# Patient Record
Sex: Female | Born: 1985 | Race: White | Hispanic: No | Marital: Single | Smoking: Never smoker
Health system: Southern US, Community
[De-identification: ages and names within clinical notes are randomized; demographics above are authoritative.]

## PROBLEM LIST (undated history)

## (undated) DIAGNOSIS — R519 Headache, unspecified: Secondary | ICD-10-CM

## (undated) DIAGNOSIS — N946 Dysmenorrhea, unspecified: Secondary | ICD-10-CM

## (undated) DIAGNOSIS — G8929 Other chronic pain: Secondary | ICD-10-CM

## (undated) DIAGNOSIS — N926 Irregular menstruation, unspecified: Secondary | ICD-10-CM

## (undated) DIAGNOSIS — IMO0002 Reserved for concepts with insufficient information to code with codable children: Secondary | ICD-10-CM

## (undated) DIAGNOSIS — R87619 Unspecified abnormal cytological findings in specimens from cervix uteri: Secondary | ICD-10-CM

## (undated) DIAGNOSIS — R51 Headache: Principal | ICD-10-CM

## (undated) HISTORY — DX: Other chronic pain: G89.29

## (undated) HISTORY — DX: Irregular menstruation, unspecified: N92.6

## (undated) HISTORY — DX: Headache: R51

## (undated) HISTORY — DX: Headache, unspecified: R51.9

## (undated) HISTORY — PX: ADENOIDECTOMY: SUR15

## (undated) HISTORY — DX: Dysmenorrhea, unspecified: N94.6

## (undated) HISTORY — DX: Reserved for concepts with insufficient information to code with codable children: IMO0002

## (undated) HISTORY — PX: WISDOM TOOTH EXTRACTION: SHX21

## (undated) HISTORY — DX: Unspecified abnormal cytological findings in specimens from cervix uteri: R87.619

---

## 2003-12-16 ENCOUNTER — Other Ambulatory Visit: Admission: RE | Admit: 2003-12-16 | Discharge: 2003-12-16 | Payer: Self-pay | Admitting: Family Medicine

## 2005-03-19 ENCOUNTER — Other Ambulatory Visit: Admission: RE | Admit: 2005-03-19 | Discharge: 2005-03-19 | Payer: Self-pay | Admitting: Family Medicine

## 2006-01-01 ENCOUNTER — Encounter: Admission: RE | Admit: 2006-01-01 | Discharge: 2006-01-01 | Payer: Self-pay | Admitting: Family Medicine

## 2007-03-13 ENCOUNTER — Emergency Department (HOSPITAL_COMMUNITY): Admission: EM | Admit: 2007-03-13 | Discharge: 2007-03-13 | Payer: Self-pay | Admitting: Emergency Medicine

## 2011-01-01 ENCOUNTER — Encounter
Admission: RE | Admit: 2011-01-01 | Discharge: 2011-01-01 | Payer: Self-pay | Source: Home / Self Care | Attending: Family Medicine | Admitting: Family Medicine

## 2012-01-11 IMAGING — CT CT HEAD W/O CM
2 series · 16 of 30 positions shown, 18 images · non-contrast
Comparison: None.

CLINICAL DATA: Syncopal episode last night, fell hitting left
frontal region, some unsteady gait and lightheadedness

CT HEAD WITHOUT CONTRAST
TECHNIQUE: Contiguous axial images were obtained from the base of
the skull through the vertex without contrast.

[Series 2: head w/o · axial · non-contrast · 0.49mm/px · z∈[+46,+168]mm · 8 of 32 slices shown, 10 images]
[im 4/32  brain]
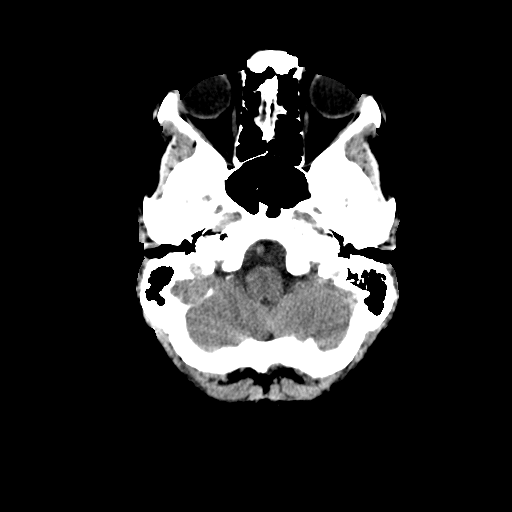
[im 4/32  bone]
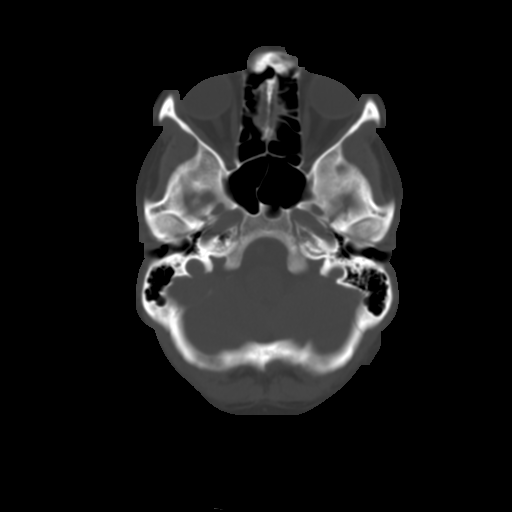
[im 7/32  brain]
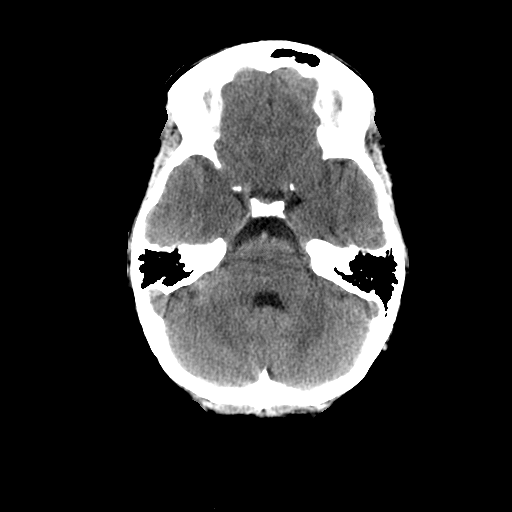
[im 11/32  brain]
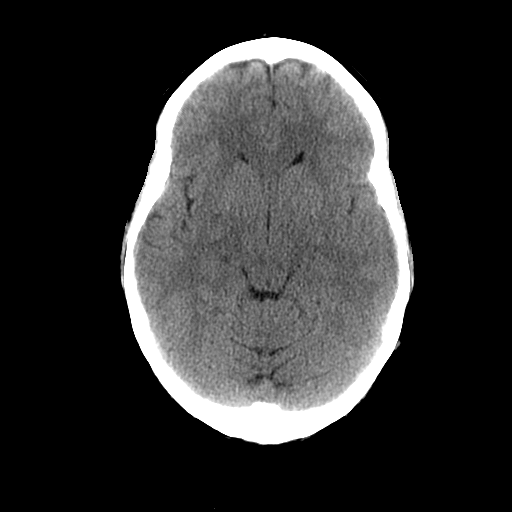
[im 14/32  brain]
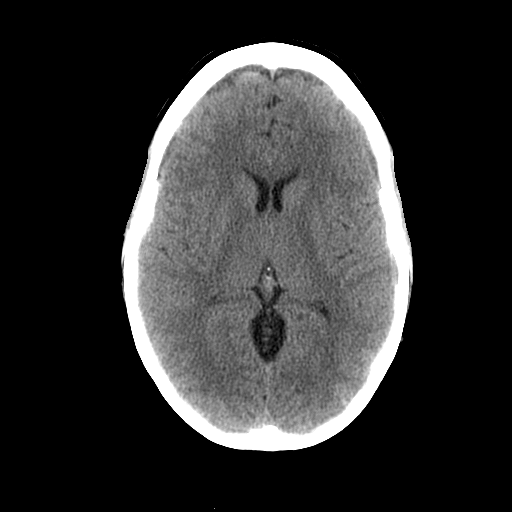
[im 18/32  brain]
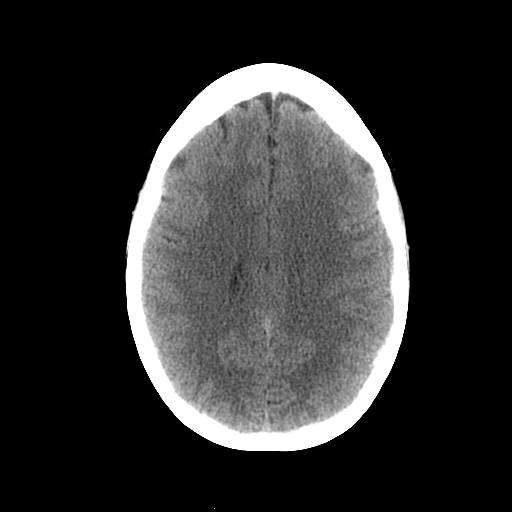
[im 18/32  bone]
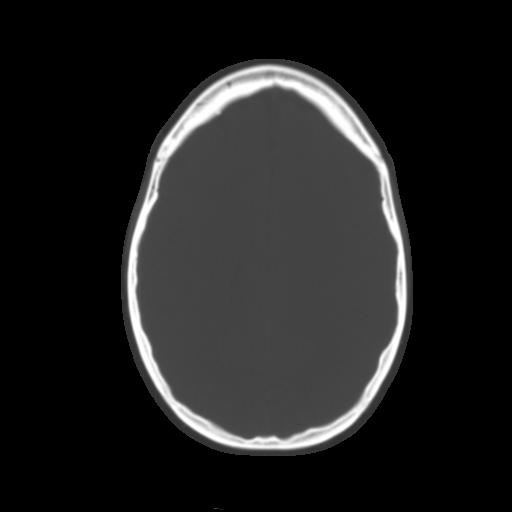
[im 21/32  brain]
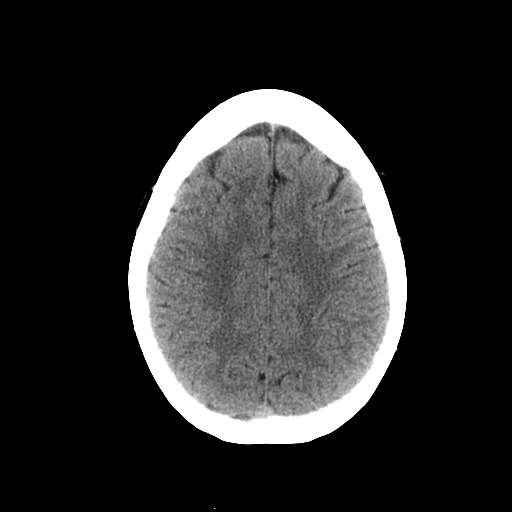
[im 25/32  brain]
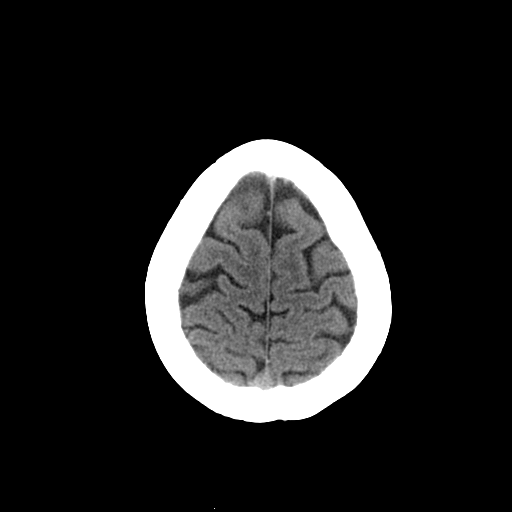
[im 28/32  brain]
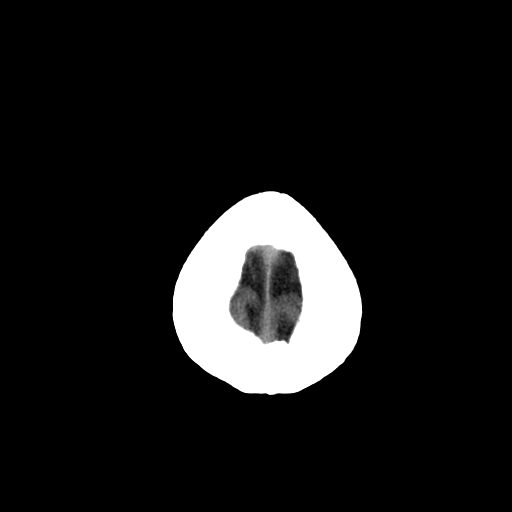

[Series 3: head bone · axial · 0.49mm/px · z∈[+44,+172]mm · 8 of 64 slices shown]
[im 7/64  bone]
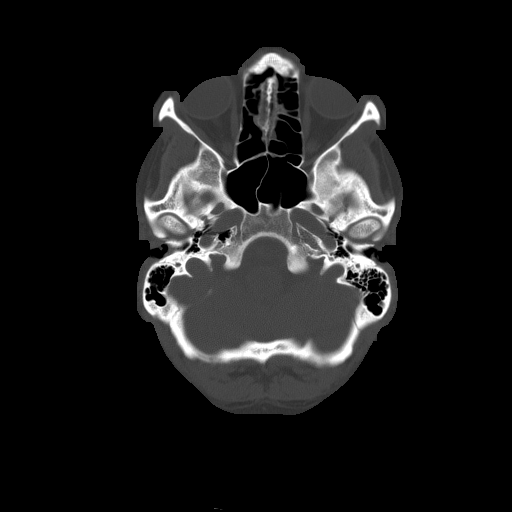
[im 14/64  bone]
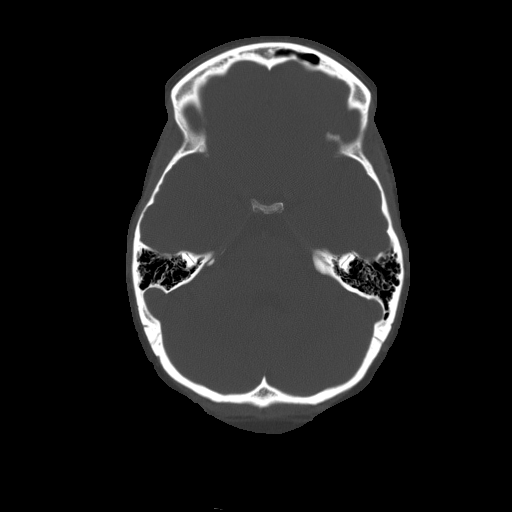
[im 20/64  bone]
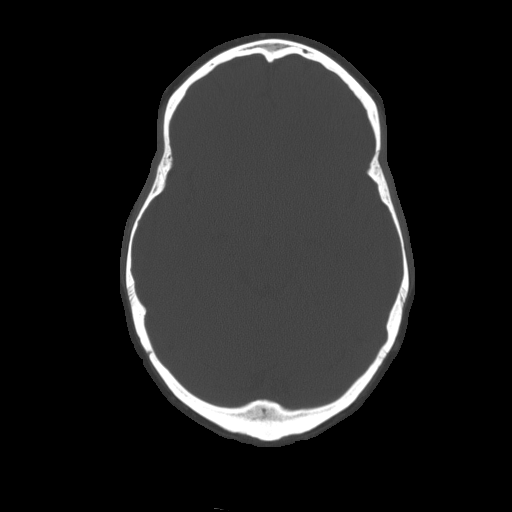
[im 27/64  bone]
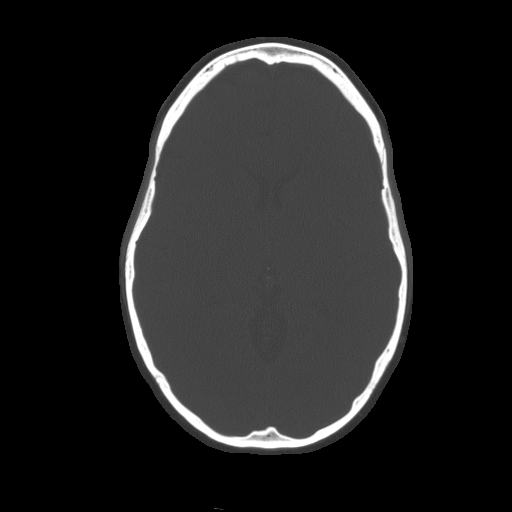
[im 37/64  bone]
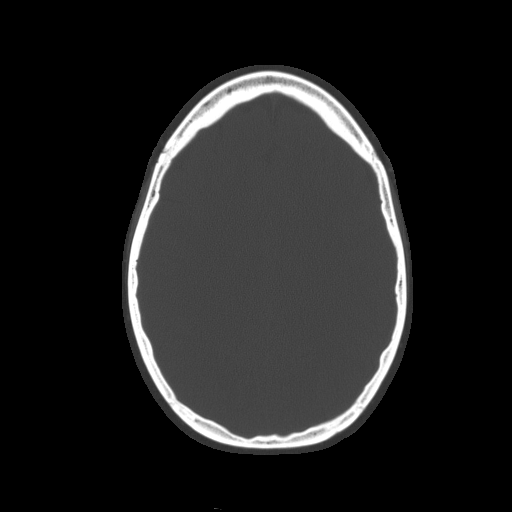
[im 44/64  bone]
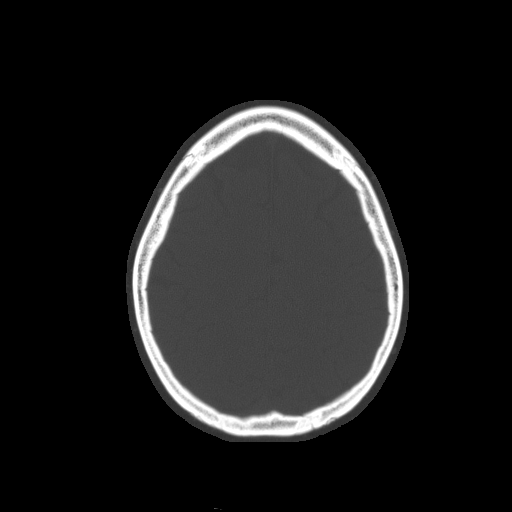
[im 50/64  bone]
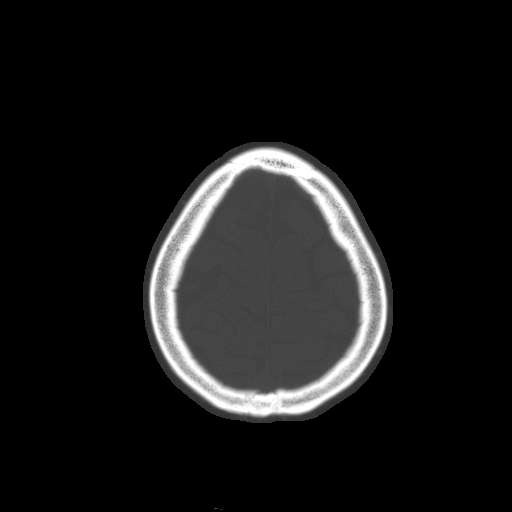
[im 57/64  bone]
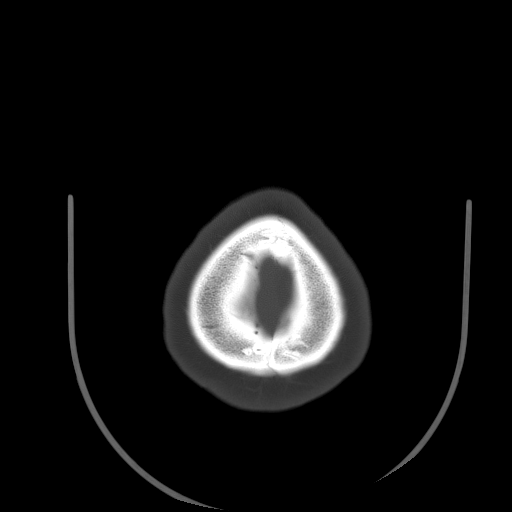

[16 of 30 positions shown; findings below may reference images not displayed]

FINDINGS: The ventricular system is normal in size and
configuration, and the septum is in a normal midline position.  The
fourth ventricle and basilar cisterns appear normal.  No
hemorrhage, mass lesion, or acute infarction is seen.  On bone
window images, no calvarial abnormality is noted.  The paranasal
sinuses that are visualized are clear.
IMPRESSION: Negative unenhanced CT of the brain.

## 2012-07-03 DIAGNOSIS — IMO0002 Reserved for concepts with insufficient information to code with codable children: Secondary | ICD-10-CM | POA: Insufficient documentation

## 2012-07-03 DIAGNOSIS — N926 Irregular menstruation, unspecified: Secondary | ICD-10-CM | POA: Insufficient documentation

## 2012-07-03 DIAGNOSIS — N946 Dysmenorrhea, unspecified: Secondary | ICD-10-CM | POA: Insufficient documentation

## 2012-07-09 ENCOUNTER — Ambulatory Visit: Payer: Self-pay | Admitting: Obstetrics and Gynecology

## 2012-09-01 ENCOUNTER — Ambulatory Visit (INDEPENDENT_AMBULATORY_CARE_PROVIDER_SITE_OTHER): Payer: BC Managed Care – PPO | Admitting: Obstetrics and Gynecology

## 2012-09-01 ENCOUNTER — Encounter: Payer: Self-pay | Admitting: Obstetrics and Gynecology

## 2012-09-01 VITALS — BP 100/72 | HR 90 | Resp 14 | Ht 66.5 in | Wt 175.0 lb

## 2012-09-01 DIAGNOSIS — N926 Irregular menstruation, unspecified: Secondary | ICD-10-CM

## 2012-09-01 DIAGNOSIS — IMO0002 Reserved for concepts with insufficient information to code with codable children: Secondary | ICD-10-CM

## 2012-09-01 DIAGNOSIS — N946 Dysmenorrhea, unspecified: Secondary | ICD-10-CM

## 2012-09-01 DIAGNOSIS — R51 Headache: Secondary | ICD-10-CM

## 2012-09-01 DIAGNOSIS — Z01419 Encounter for gynecological examination (general) (routine) without abnormal findings: Secondary | ICD-10-CM

## 2012-09-01 DIAGNOSIS — R6889 Other general symptoms and signs: Secondary | ICD-10-CM

## 2012-09-01 DIAGNOSIS — L659 Nonscarring hair loss, unspecified: Secondary | ICD-10-CM

## 2012-09-01 DIAGNOSIS — Z124 Encounter for screening for malignant neoplasm of cervix: Secondary | ICD-10-CM

## 2012-09-01 NOTE — Progress Notes (Signed)
   Subjective:    Jean Bailey is a 26 y.o. female, G0P0000, who presents for an annual exam. She c/o increased hair loss over the last year   Last Pap: 06/2011 WNL: Yes Regular Periods:yes Contraception: None  Monthly Breast exam:no Tetanus<42yrs:yes Nl.Bladder Function:yes Daily BMs:yes Healthy Diet:yes Calcium:no Mammogram:no Date of Mammogram: N/A Exercise:yes Have often Exercise: X 3 times a week. Seatbelt: yes Abuse at home: no Stressful work:yes Sigmoid-colonoscopy: N/A Bone Density: No PCP: Dr.Mitchell Change in PMH: No changes. Change in FMH:No Changes.    History   Social History  . Marital Status: Single    Spouse Name: N/A    Number of Children: N/A  . Years of Education: N/A   Social History Main Topics  . Smoking status: Never Smoker   . Smokeless tobacco: Never Used  . Alcohol Use: Yes     socially  . Drug Use: No  . Sexually Active: No   Other Topics Concern  . None   Social History Narrative  . None    Menstrual cycle:   LMP: Patient's last menstrual period was 08/05/2012.           Cycle: usually q4-5 wks.  Some cramping relieved by Advil  The following portions of the patient's history were reviewed and updated as appropriate: allergies, current medications, past family history, past medical history, past social history, past surgical history and problem list.  Review of Systems Pertinent items are noted in HPI. Breast:Negative for breast lump,nipple discharge or nipple retraction Gastrointestinal: Negative for abdominal pain, change in bowel habits or rectal bleeding Urinary:negative   Objective:    BP 100/72  Pulse 90  Resp 14  Ht 5' 6.5" (1.689 m)  Wt 175 lb (79.379 kg)  BMI 27.82 kg/m2  LMP 08/05/2012    Weight:  Wt Readings from Last 1 Encounters:  09/01/12 175 lb (79.379 kg)          BMI: Body mass index is 27.82 kg/(m^2).  General Appearance: Alert, appropriate appearance for age. No acute distress HEENT:  Grossly normal Neck / Thyroid: Supple, no masses, nodes or enlargement Lungs: clear to auscultation bilaterally Back: No CVA tenderness Breast Exam: No masses or nodes.No dimpling, nipple retraction or discharge. Cardiovascular: Regular rate and rhythm. S1, S2, no murmur Gastrointestinal: Soft, non-tender, no masses or organomegaly Pelvic Exam: Vulva and vagina appear normal. Bimanual exam reveals normal uterus and adnexa. Rectovaginal: normal rectal, no masses Lymphatic Exam: Non-palpable nodes in neck, clavicular, axillary, or inguinal regions Skin: no rash or abnormalities Neurologic: Normal gait and speech, no tremor  Psychiatric: Alert and oriented, appropriate affect.   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

## 2012-09-02 LAB — PAP IG W/ RFLX HPV ASCU

## 2012-09-02 LAB — CBC
HCT: 41 % (ref 36.0–46.0)
Hemoglobin: 13.8 g/dL (ref 12.0–15.0)
MCH: 30.2 pg (ref 26.0–34.0)
MCHC: 33.7 g/dL (ref 30.0–36.0)
MCV: 89.7 fL (ref 78.0–100.0)
RBC: 4.57 MIL/uL (ref 3.87–5.11)
RDW: 13.2 % (ref 11.5–15.5)
WBC: 7.2 10*3/uL (ref 4.0–10.5)

## 2012-09-21 ENCOUNTER — Telehealth: Payer: Self-pay | Admitting: Obstetrics and Gynecology

## 2012-09-21 ENCOUNTER — Telehealth: Payer: Self-pay

## 2012-09-21 NOTE — Telephone Encounter (Signed)
Tc to pt per headaches x 3 days. Pt c/o some visual changes and dizziness. Pt drinking approx 9 8oz glasses of water daily and has tried Tylenol XS without improvement. Pt with h/o migraines in middle school. Informed pt to try Ibuprofen OTC 600mg  q 6hours x 24 hours. Told to cb with report of how headaches are going. Pt to cb before 24 hours if sx's worsen or headaches persist. Pt voices understanding.

## 2012-09-21 NOTE — Telephone Encounter (Signed)
Addendum:09/21/12 telephone call documentation entered in error. Pt with c/o hair thinning. Informed to follow up with pcp for further eval due to normal TSH,CBC, and Vit D testing. Pt voices understanding.    

## 2012-09-21 NOTE — Telephone Encounter (Signed)
vph pt 

## 2012-09-21 NOTE — Telephone Encounter (Signed)
Addendum:09/21/12 telephone call documentation entered in error. Pt with c/o hair thinning. Informed to follow up with pcp for further eval due to normal TSH,CBC, and Vit D testing. Pt voices understanding.

## 2013-01-16 ENCOUNTER — Other Ambulatory Visit: Payer: Self-pay

## 2013-09-15 ENCOUNTER — Ambulatory Visit
Admission: RE | Admit: 2013-09-15 | Discharge: 2013-09-15 | Disposition: A | Discharge status: Home / Self Care | Payer: BC Managed Care – PPO | Source: Ambulatory Visit | Attending: Physician Assistant | Admitting: Physician Assistant

## 2013-09-15 ENCOUNTER — Other Ambulatory Visit: Payer: Self-pay | Admitting: Physician Assistant

## 2013-09-15 DIAGNOSIS — R0789 Other chest pain: Secondary | ICD-10-CM

## 2013-10-07 ENCOUNTER — Other Ambulatory Visit: Payer: Self-pay

## 2014-09-25 IMAGING — CR DG CHEST 2V
2 series · 2 of 2 positions shown · non-contrast
Comparison: Chest x-ray 01/01/2006

CLINICAL DATA: Chest tightness for 2 weeks

EXAM:
CHEST  2 VIEW

[w chest pa]
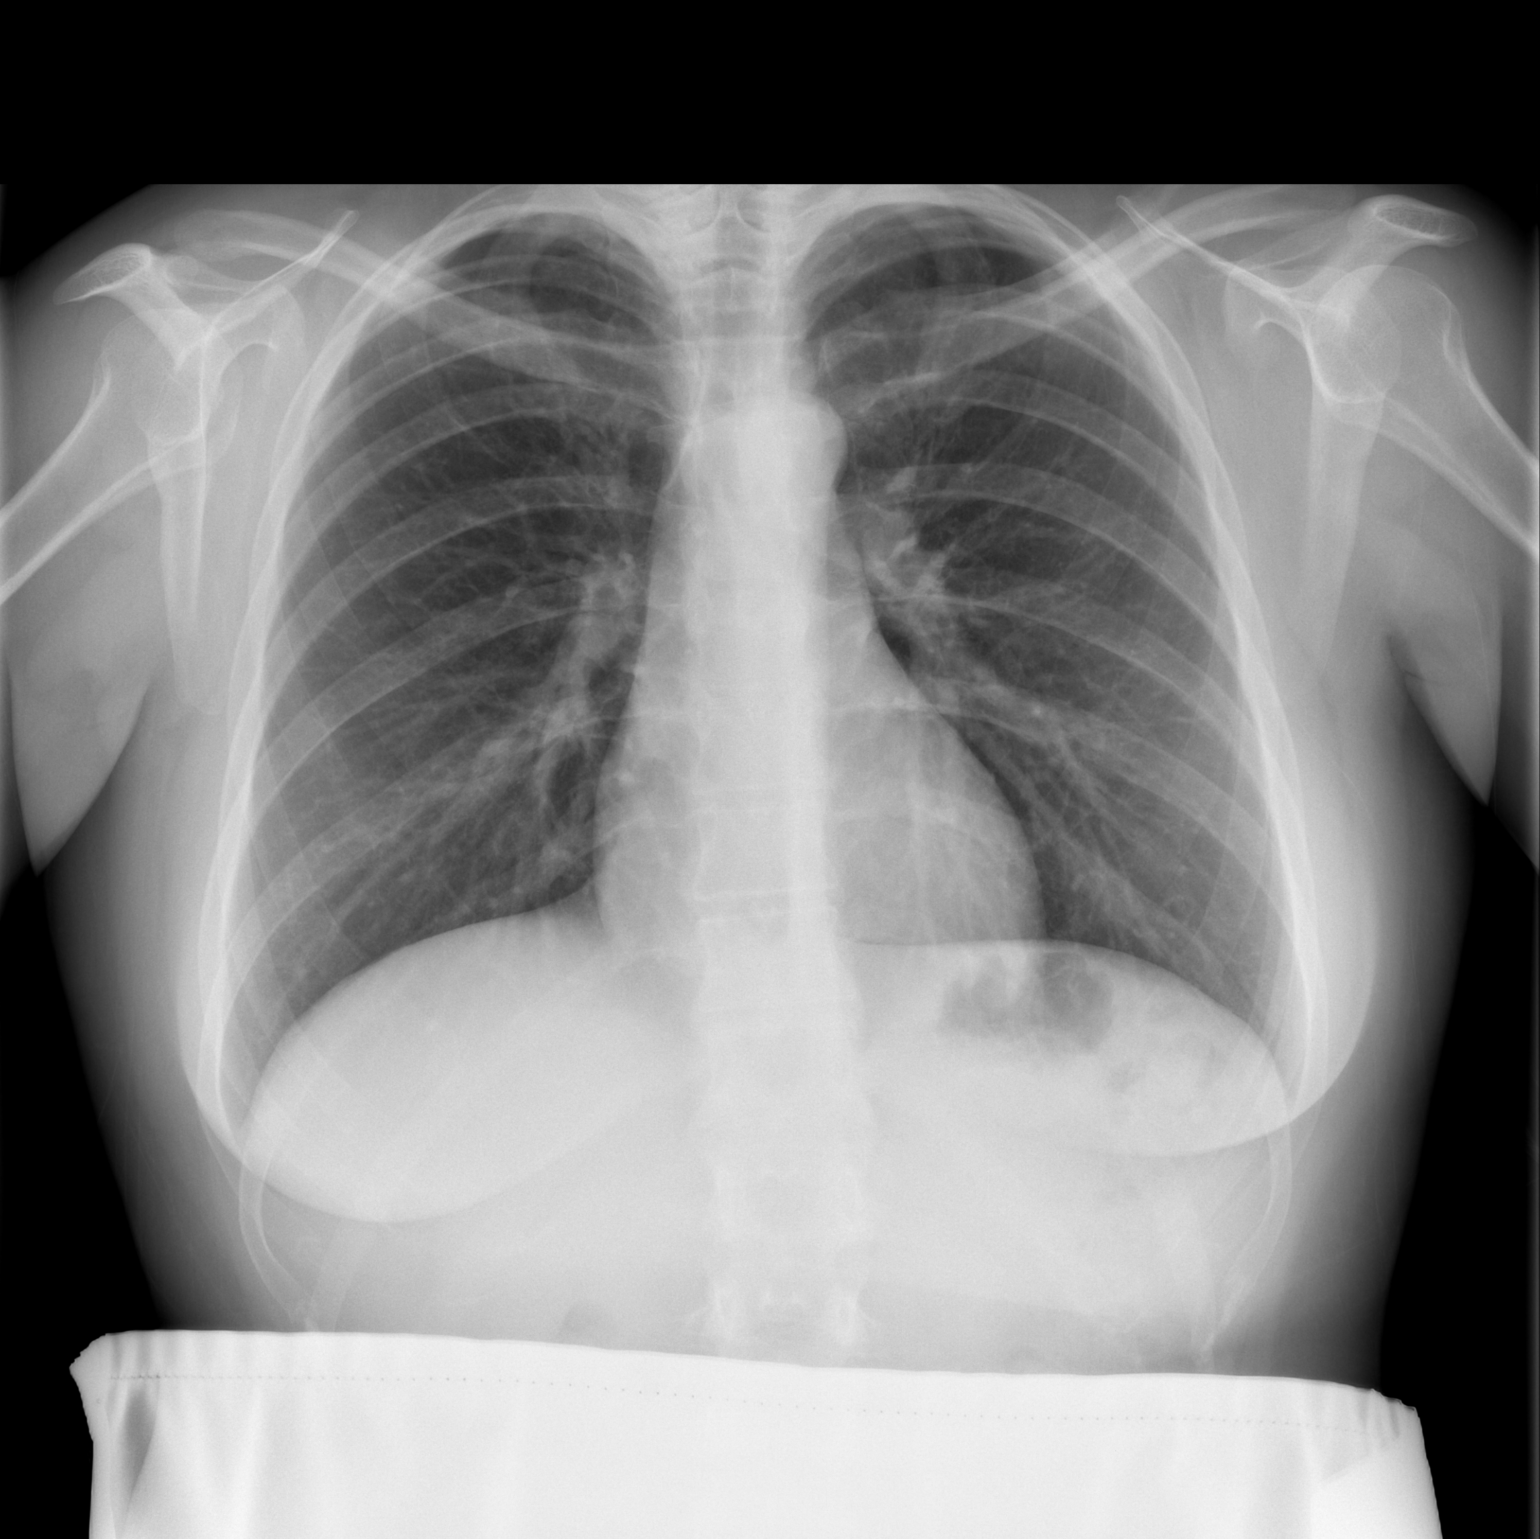

[w chest lat]
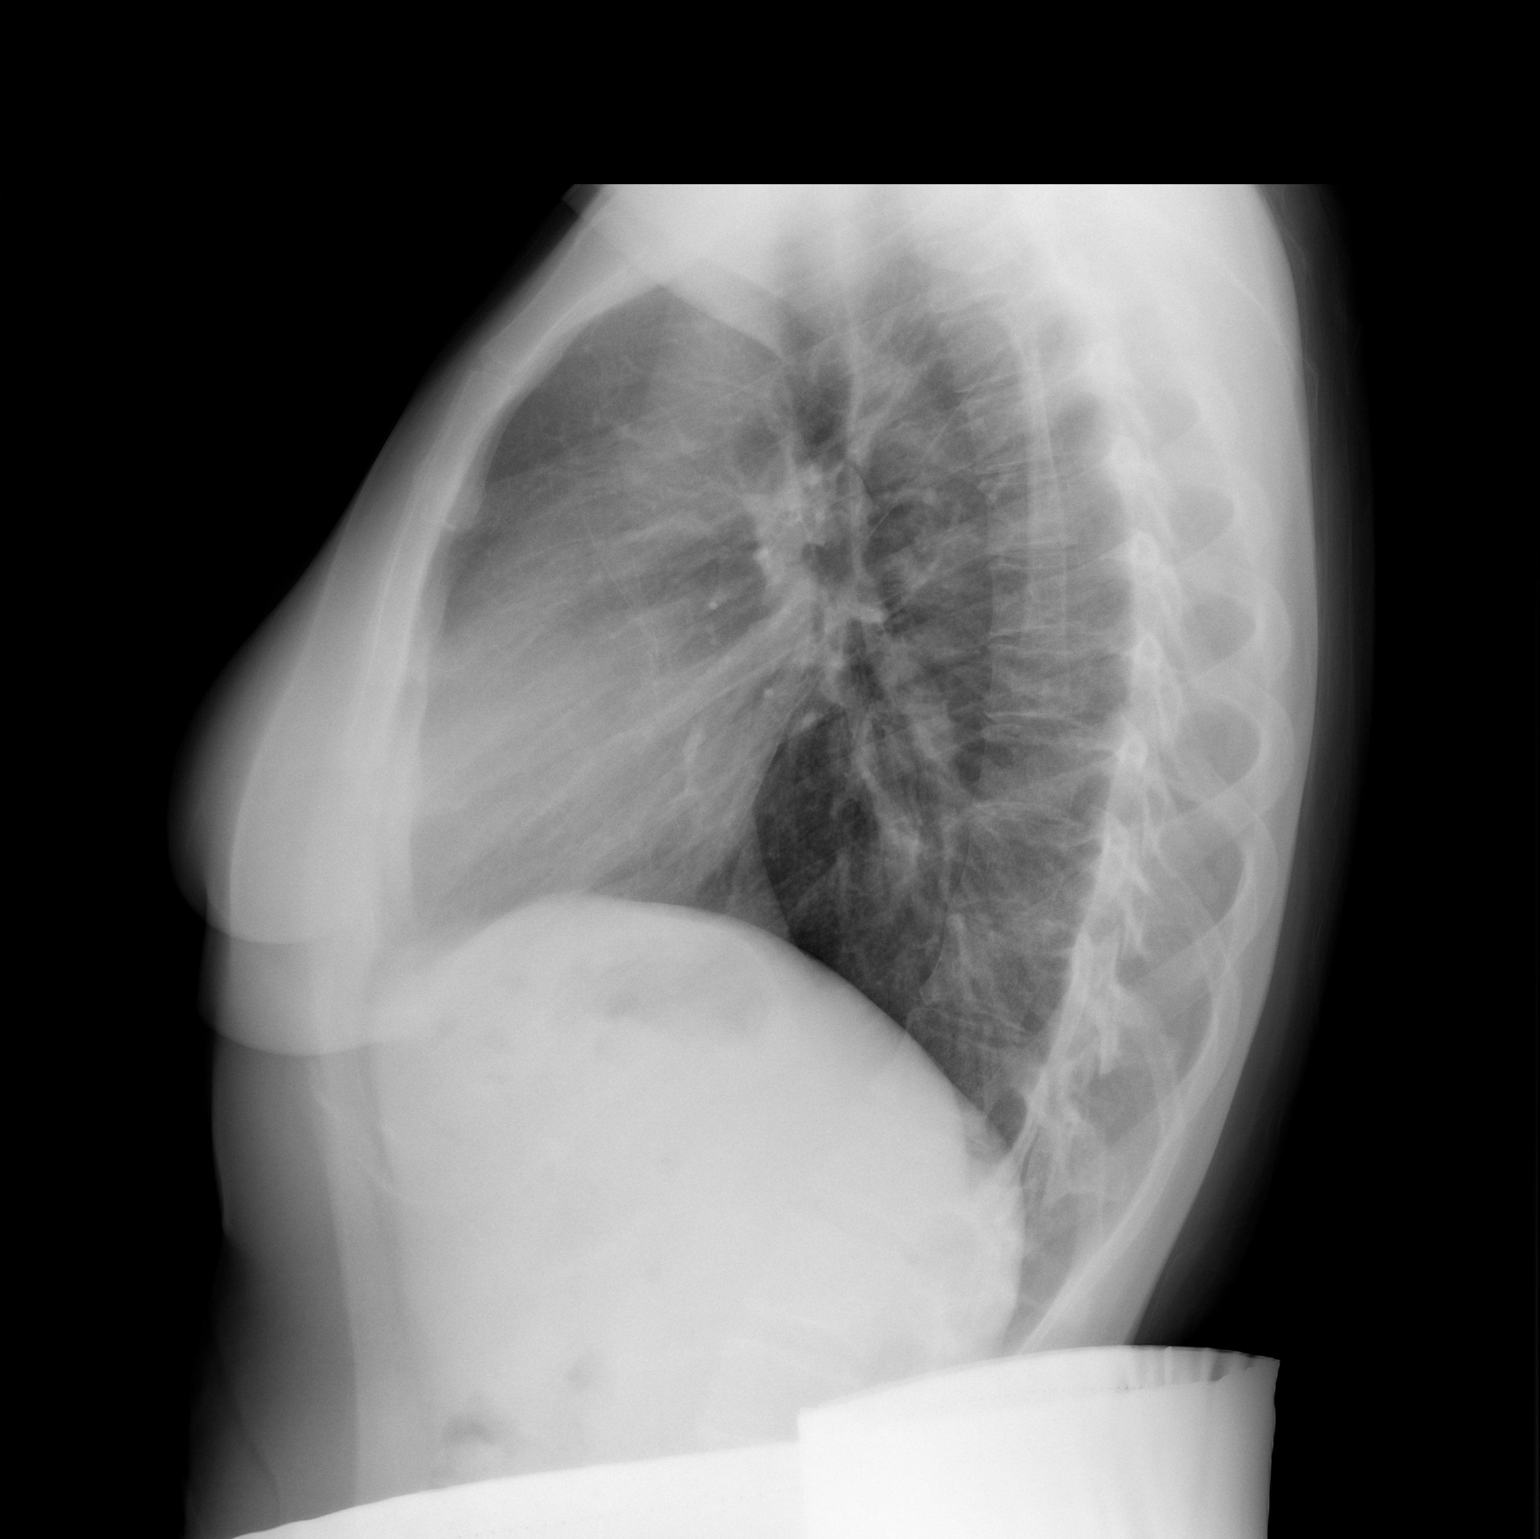

[2 of 2 positions shown; findings below may reference images not displayed]

FINDINGS: No active infiltrate or effusion is seen. Mediastinal contours are
stable. The heart is within normal limits in size and stable. No
bony abnormality is seen.
IMPRESSION: No active lung disease.

## 2018-09-06 DIAGNOSIS — F331 Major depressive disorder, recurrent, moderate: Secondary | ICD-10-CM | POA: Insufficient documentation

## 2018-09-06 DIAGNOSIS — F411 Generalized anxiety disorder: Secondary | ICD-10-CM | POA: Insufficient documentation

## 2018-09-18 ENCOUNTER — Encounter: Payer: Self-pay | Admitting: Physician Assistant

## 2018-09-18 ENCOUNTER — Ambulatory Visit: Payer: BLUE CROSS/BLUE SHIELD | Admitting: Physician Assistant

## 2018-09-18 DIAGNOSIS — F411 Generalized anxiety disorder: Secondary | ICD-10-CM | POA: Diagnosis not present

## 2018-09-18 DIAGNOSIS — F331 Major depressive disorder, recurrent, moderate: Secondary | ICD-10-CM | POA: Diagnosis not present

## 2018-09-18 MED ORDER — BUPROPION HCL ER (XL) 150 MG PO TB24: 150.0000 mg | ORAL_TABLET | ORAL | 1 refills | Status: DC

## 2018-09-18 MED ORDER — ESCITALOPRAM OXALATE 10 MG PO TABS: 10.0000 mg | ORAL_TABLET | Freq: Every day | ORAL | 1 refills | Status: DC

## 2018-09-18 NOTE — Progress Notes (Signed)
[facility]  Patient ID: Jean Bailey,  MRN: [id number]  PCP: Jean Bailey.Jean Saucer, MD  Date of Evaluation: 09/18/2018 Time spent:15 minutes   HISTORY/CURRENT STATUS: HPI The past few months have been stressful.  Her car broke down and she's had to get it worked on.  The mechanics were 'shady' and messed it up more.Has been stressed too b/c she lives with her mother and sister and that's 'not the best environment.' She's moving into her grandmother's old house but hasn't been able to fix it up.  Feels tired a lot even when she rests well. Sleeps 'ok.'  Not missing work.  Does feel less energetic than she'd like.  Motivation is good. She's working on her [employer] and does that on weekend, she does things with friends.    Does feel anxious at times but is able to 'deal with it.' Never takes Ativan.  Past medications for mental health diagnoses include: Lexapro, Wellbutrin, Ativan Individual Medical History/ Review of Systems: Changes? :  No  Allergies: Sulfa antibiotics  Current Medications:  Current Outpatient Medications:  .  buPROPion (WELLBUTRIN XL) 150 MG 24 hr tablet, Take 1 tablet (150 mg total) by mouth every morning., Disp: 90 tablet, Rfl: 1 .  Multiple Vitamin (MULTIVITAMIN) tablet, Take 1 tablet by mouth daily., Disp: , Rfl:  .  escitalopram (LEXAPRO) 10 MG tablet, Take 1 tablet (10 mg total) by mouth daily., Disp: 90 tablet, Rfl: 1 .  fexofenadine (ALLEGRA) 30 MG tablet, Take 30 mg by mouth 2 (two) times daily., Disp: , Rfl:  .  LORazepam (ATIVAN) 0.5 MG tablet, Take 0.5 mg by mouth 2 (two) times daily as needed for anxiety (1/2-1po bid prn)., Disp: , Rfl:  Medication Side Effects: None  Family Medical/ Social History: Changes? Is now a PTA, works at a [employer].  MENTAL HEALTH EXAM:  There were no vitals taken for this visit.There is no height or weight on file to calculate BMI.  General Appearance: Well Groomed  Eye Contact:  Good  Speech:   Clear and Coherent speaks softly but her norm  Volume:  Normal  Mood:  Euthymic  Affect:  Appropriate  Thought Process:  Goal Directed  Orientation:  Full (Time, Place, and Person)  Thought Content: Logical   Suicidal Thoughts:  No  Homicidal Thoughts:  No  Memory:  Immediate  Judgement:  Good  Insight:  Good  Psychomotor Activity:  Normal  Concentration:  Concentration: Good  Recall:  Good  Fund of Knowledge: Good  Language: Good  Akathisia:  No  AIMS (if indicated): not done  Assets:  Desire for Improvement  ADL's:  Intact  Cognition: WNL  Prognosis:  Good    DIAGNOSES:    ICD-10-CM   1. Major depressive disorder, recurrent episode, moderate (HCC) F33.1   2. Generalized anxiety disorder F41.1     RECOMMENDATIONS: Increase Lexapro from 7.5 mg to 10 mg. Continue Wellbutrin XL 150 mg every morning. Discussed that some of this is situational depression but can definitely be improved with increasing the antidepressant.  Once she gets moved into her grandmother's home and gets more settled, I believe she will be feeling better. Return in 6 weeks or sooner as needed.    Jean Overly, PA-C

## 2018-10-26 ENCOUNTER — Encounter: Payer: Self-pay | Admitting: Emergency Medicine

## 2018-10-26 DIAGNOSIS — G479 Sleep disorder, unspecified: Secondary | ICD-10-CM | POA: Insufficient documentation

## 2018-11-06 ENCOUNTER — Encounter: Payer: Self-pay | Admitting: Physician Assistant

## 2018-11-06 ENCOUNTER — Ambulatory Visit: Payer: BLUE CROSS/BLUE SHIELD | Admitting: Physician Assistant

## 2018-11-06 DIAGNOSIS — G4700 Insomnia, unspecified: Secondary | ICD-10-CM

## 2018-11-06 DIAGNOSIS — F331 Major depressive disorder, recurrent, moderate: Secondary | ICD-10-CM | POA: Diagnosis not present

## 2018-11-06 DIAGNOSIS — F411 Generalized anxiety disorder: Secondary | ICD-10-CM | POA: Diagnosis not present

## 2018-11-06 MED ORDER — BUPROPION HCL ER (XL) 150 MG PO TB24
150.0000 mg | ORAL_TABLET | ORAL | 5 refills | Status: DC
Start: 2018-11-06 — End: 2019-05-07

## 2018-11-06 MED ORDER — ESCITALOPRAM OXALATE 10 MG PO TABS
10.0000 mg | ORAL_TABLET | Freq: Every day | ORAL | 5 refills | Status: DC
Start: 2018-11-06 — End: 2019-05-07

## 2018-11-06 NOTE — Progress Notes (Signed)
[facility]  Patient ID: Despina HickRachel L Doutt,  MRN: [id number]  PCP: Asencion GowdaMitchell, L.August Saucerean, MD  Date of Evaluation: 11/06/2018 Time spent:15 minutes  Chief Complaint:  Chief Complaint    Follow-up      HISTORY/CURRENT STATUS: HPI here for 6-week med check.  Since she was last seen, things have been better.  We had increased the Lexapro from 7.5 mg to 10 mg.  She is unsure if the change in the dose is what has helped her feel better or just thinks in general with life have improved.  Her job is going well.  She has a place to live now and she has a car.  "I may just be able to deal with things better.  I do not know." Patient denies loss of interest in usual activities and is able to enjoy things.  Denies decreased energy or motivation.  Appetite has not changed.  No extreme sadness, tearfulness, or feelings of hopelessness.  Denies any changes in concentration, making decisions or remembering things.  Denies suicidal or homicidal thoughts.  She does have some generalized anxiety on occasion but it is rare and she is not using the Ativan often.  Work is still busy but going well.  She likes her job.  She is sleeping good most of the time.  Individual Medical History/ Review of Systems: Changes? :No    Past medications for mental health diagnoses include: Lexapro, Wellbutrin, Ativan  Allergies: Sulfa antibiotics  Current Medications:  Current Outpatient Medications:  .  buPROPion (WELLBUTRIN XL) 150 MG 24 hr tablet, Take 1 tablet (150 mg total) by mouth every morning., Disp: 30 tablet, Rfl: 5 .  escitalopram (LEXAPRO) 10 MG tablet, Take 1 tablet (10 mg total) by mouth daily., Disp: 30 tablet, Rfl: 5 .  LORazepam (ATIVAN) 0.5 MG tablet, Take 0.5 mg by mouth 2 (two) times daily as needed for anxiety (1/2-1po bid prn)., Disp: , Rfl:  .  Multiple Vitamin (MULTIVITAMIN) tablet, Take 1 tablet by mouth daily., Disp: , Rfl:  Medication Side Effects: none  Family Medical/ Social  History: Changes? No  MENTAL HEALTH EXAM:  There were no vitals taken for this visit.There is no height or weight on file to calculate BMI.  General Appearance: Casual, Neat and Well Groomed  Eye Contact:  Good  Speech:  Clear and Coherent  Volume:  Normal  Mood:  Euthymic  Affect:  Appropriate  Thought Process:  Goal Directed  Orientation:  Full (Time, Place, and Person)  Thought Content: Logical   Suicidal Thoughts:  No  Homicidal Thoughts:  No  Memory:  WNL  Judgement:  Good  Insight:  Good  Psychomotor Activity:  Normal  Concentration:  Concentration: Good  Recall:  Good  Fund of Knowledge: Good  Language: Good  Assets:  Desire for Improvement  ADL's:  Intact  Cognition: WNL  Prognosis:  Good    DIAGNOSES:    ICD-10-CM   1. Major depressive disorder, recurrent episode, moderate (HCC) F33.1   2. Generalized anxiety disorder F41.1   3. Insomnia, unspecified type G47.00     Receiving Psychotherapy: No    RECOMMENDATIONS: I am glad to see her feeling so much better. Continue all current medications. Return in 6 months.  Melony Overlyeresa Kristofor Michalowski, PA-C

## 2019-05-07 ENCOUNTER — Encounter: Payer: Self-pay | Admitting: Physician Assistant

## 2019-05-07 ENCOUNTER — Ambulatory Visit: Payer: BLUE CROSS/BLUE SHIELD | Admitting: Physician Assistant

## 2019-05-07 ENCOUNTER — Other Ambulatory Visit: Payer: Self-pay

## 2019-05-07 DIAGNOSIS — F411 Generalized anxiety disorder: Secondary | ICD-10-CM

## 2019-05-07 DIAGNOSIS — G4700 Insomnia, unspecified: Secondary | ICD-10-CM

## 2019-05-07 DIAGNOSIS — F331 Major depressive disorder, recurrent, moderate: Secondary | ICD-10-CM | POA: Diagnosis not present

## 2019-05-07 MED ORDER — BUPROPION HCL ER (XL) 150 MG PO TB24: 150.0000 mg | ORAL_TABLET | ORAL | 5 refills | Status: DC

## 2019-05-07 MED ORDER — ESCITALOPRAM OXALATE 10 MG PO TABS
10.0000 mg | ORAL_TABLET | Freq: Every day | ORAL | 5 refills | Status: DC
Start: 2019-05-07 — End: 2020-01-14

## 2019-05-07 NOTE — Progress Notes (Signed)
[facility]  Patient ID: Jean Bailey,  MRN: [id number]  PCP: Asencion GowdaMitchell, L.August Saucerean, MD  Date of Evaluation: 05/07/2019 Time spent:15 minutes  Chief Complaint:  Chief Complaint    Follow-up      HISTORY/CURRENT STATUS: HPI For 6 month med check.  For past few weeks, has been more overwhelmed.  "I am not sure if it is because of everything that is going on in the world or if it is my brain chemistry."  She refers to the coronavirus pandemic as well as the protest and riots going on in the country right now.  Her energy is a little low although she is not missing work.  She is not isolating.  She has been going to work as she normally does.  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  She has felt sad and not wanting to do as much as she used to.  She still tries to get together with friends as much as possible though.  Because of curfews and the isolation and shelter in place, that has not happened as much though.  Denies anxiety.  She has not taken an Ativan in many months.  She sleeps well and feels rested when she gets up.  Denies dizziness, syncope, seizures, numbness, tingling, tremor, tics, unsteady gait, slurred speech, confusion. Denies muscle or joint pain, stiffness, or dystonia.  Individual Medical History/ Review of Systems: Changes? :No    Past medications for mental health diagnoses include: Lexapro, Wellbutrin, Ativan  Allergies: Sulfa antibiotics  Current Medications:  Current Outpatient Medications:  .  buPROPion (WELLBUTRIN XL) 150 MG 24 hr tablet, Take 1-2 tablets (150-300 mg total) by mouth every morning., Disp: 60 tablet, Rfl: 5 .  escitalopram (LEXAPRO) 10 MG tablet, Take 1 tablet (10 mg total) by mouth daily., Disp: 30 tablet, Rfl: 5 .  Multiple Vitamin (MULTIVITAMIN) tablet, Take 1 tablet by mouth daily., Disp: , Rfl:  Medication Side Effects: none  Family Medical/ Social History: Changes? Yes w/  coronavirus pandemic has made work difficult. She's a PTA.   MENTAL HEALTH EXAM:  There were no vitals taken for this visit.There is no height or weight on file to calculate BMI.  General Appearance: Casual  Eye Contact:  Good  Speech:  Clear and Coherent  Volume:  Normal  Mood:  Anxious  Affect:  Appropriate  Thought Process:  Goal Directed  Orientation:  Full (Time, Place, and Person)  Thought Content: Logical   Suicidal Thoughts:  No  Homicidal Thoughts:  No  Memory:  WNL  Judgement:  Good  Insight:  Good  Psychomotor Activity:  Normal  Concentration:  Concentration: Good  Recall:  Good  Fund of Knowledge: Good  Language: Good  Assets:  Desire for Improvement  ADL's:  Intact  Cognition: WNL  Prognosis:  Good    DIAGNOSES:    ICD-10-CM   1. Major depressive disorder, recurrent episode, moderate (HCC) F33.1   2. Generalized anxiety disorder F41.1   3. Insomnia, unspecified type G47.00     Receiving Psychotherapy: No    RECOMMENDATIONS:  Increase Wellbutrin XL 150 mg to 2 p.o. every morning.  This may increase anxiety for the first week or so.  If that does not occur and does not resolve after a couple of weeks, she can decrease back to 1 pill. Continue Lexapro 10 mg every morning.  We may need to consider increasing the Lexapro if the Wellbutrin plan does  not work. Discussed coping skills, exercise, healthy eating, avoiding social media and news outlets. Return in 6 weeks if no better, but if better, can go out 6 months.   Melony Overly, PA-C   This record has been created using [employer].  Chart creation errors have been sought, but may not always have been located and corrected. Such creation errors do not reflect on the standard of medical care.

## 2019-06-18 ENCOUNTER — Ambulatory Visit: Payer: BLUE CROSS/BLUE SHIELD | Admitting: Physician Assistant

## 2019-12-17 ENCOUNTER — Ambulatory Visit: Payer: BLUE CROSS/BLUE SHIELD | Admitting: Physician Assistant

## 2020-01-14 ENCOUNTER — Other Ambulatory Visit: Payer: Self-pay

## 2020-01-14 ENCOUNTER — Ambulatory Visit (INDEPENDENT_AMBULATORY_CARE_PROVIDER_SITE_OTHER): Payer: BLUE CROSS/BLUE SHIELD | Admitting: Physician Assistant

## 2020-01-14 ENCOUNTER — Encounter: Payer: Self-pay | Admitting: Physician Assistant

## 2020-01-14 DIAGNOSIS — F3342 Major depressive disorder, recurrent, in full remission: Secondary | ICD-10-CM

## 2020-01-14 MED ORDER — ESCITALOPRAM OXALATE 10 MG PO TABS: 10.0000 mg | ORAL_TABLET | Freq: Every day | ORAL | 5 refills | Status: DC

## 2020-01-14 NOTE — Progress Notes (Signed)
[facility]  Patient ID: Jean Bailey,  MRN: [id number]  PCP: Asencion Gowda.August Saucer, MD  Date of Evaluation: 01/14/2020 Time spent:20 minutes  Chief Complaint:  Chief Complaint    Depression; Medication Refill      HISTORY/CURRENT STATUS: HPI For routine med check.  She went off the Wellbutrin b/c felt like it wasn't helping.  Feels that the Lexapro is working well enough.  She has had some stressful weeks.  She was staying with a friend's mother-in-law after the lady had hip replacement surgery a few weeks prior.  Jean Bailey checked on the lady around 7:30 AM and she was asleep and breathing just fine.  After 30 to 45 minutes, and the lady had not gotten out of bed at her usual time, Jean Bailey went to check on her and she had no pulse.  Jean Bailey performed CPR until EMS arrived but the lady did not live.  Patient knows she did all she could but she does have the "what if" questions in her mind.  "It has been a very stressful year already.  I want COVID to go away and everything to get better."  She is able to enjoy things when she has time.  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  Energy and motivation are good.  She does not cry easily.  She sleeps well most of the time.  No suicidal or homicidal thoughts.  She does have anxiety on occasion but not commonly.  And when she does, it is triggered by an external factor.  Denies dizziness, syncope, seizures, numbness, tingling, tremor, tics, unsteady gait, slurred speech, confusion. Denies muscle or joint pain, stiffness, or dystonia.  Individual Medical History/ Review of Systems: Changes? :No    Past medications for mental health diagnoses include: Lexapro, Wellbutrin, Ativan  Allergies: Sulfa antibiotics  Current Medications:  Current Outpatient Medications:  .  Ascorbic Acid (VITAMIN C) 100 MG tablet, Take 100 mg by mouth daily., Disp: , Rfl:  .  escitalopram (LEXAPRO) 10 MG tablet, Take 1 tablet (10 mg  total) by mouth daily., Disp: 30 tablet, Rfl: 5 .  Multiple Vitamin (MULTIVITAMIN) tablet, Take 1 tablet by mouth daily., Disp: , Rfl:  .  Multiple Vitamin (MULTIVITAMIN) tablet, Take 1 tablet by mouth daily., Disp: , Rfl:  .  buPROPion (WELLBUTRIN XL) 150 MG 24 hr tablet, Take 1-2 tablets (150-300 mg total) by mouth every morning. (Patient not taking: Reported on 01/14/2020), Disp: 60 tablet, Rfl: 5 Medication Side Effects: none  Family Medical/ Social History: Changes? No  MENTAL HEALTH EXAM:  There were no vitals taken for this visit.There is no height or weight on file to calculate BMI.  General Appearance: Casual, Neat and Well Groomed  Eye Contact:  Good  Speech:  Clear and Coherent  Volume:  Normal  Mood:  Euthymic  Affect:  Appropriate  Thought Process:  Goal Directed and Descriptions of Associations: Intact  Orientation:  Full (Time, Place, and Person)  Thought Content: Logical   Suicidal Thoughts:  No  Homicidal Thoughts:  No  Memory:  WNL  Judgement:  Good  Insight:  Good  Psychomotor Activity:  Normal  Concentration:  Concentration: Good  Recall:  Good  Fund of Knowledge: Good  Language: Good  Assets:  Desire for Improvement  ADL's:  Intact  Cognition: WNL  Prognosis:  Good    DIAGNOSES:    ICD-10-CM   1. Recurrent major depressive disorder, in full remission (HCC)  F33.42  Receiving Psychotherapy: No    RECOMMENDATIONS:  I spent 20 minutes with her. PDMP was reviewed. Continue Lexapro 10 mg daily. Continue multivitamin, vitamin C. Return in 6 months.  Donnal Moat, PA-C

## 2020-07-14 ENCOUNTER — Ambulatory Visit: Payer: BLUE CROSS/BLUE SHIELD | Admitting: Physician Assistant

## 2020-08-11 ENCOUNTER — Ambulatory Visit: Payer: BLUE CROSS/BLUE SHIELD | Admitting: Physician Assistant

## 2020-09-15 ENCOUNTER — Other Ambulatory Visit: Payer: Self-pay

## 2020-09-15 ENCOUNTER — Ambulatory Visit (INDEPENDENT_AMBULATORY_CARE_PROVIDER_SITE_OTHER): Payer: BLUE CROSS/BLUE SHIELD | Admitting: Physician Assistant

## 2020-09-15 ENCOUNTER — Encounter: Payer: Self-pay | Admitting: Physician Assistant

## 2020-09-15 DIAGNOSIS — F3281 Premenstrual dysphoric disorder: Secondary | ICD-10-CM | POA: Diagnosis not present

## 2020-09-15 DIAGNOSIS — F331 Major depressive disorder, recurrent, moderate: Secondary | ICD-10-CM | POA: Diagnosis not present

## 2020-09-15 DIAGNOSIS — F411 Generalized anxiety disorder: Secondary | ICD-10-CM | POA: Diagnosis not present

## 2020-09-15 MED ORDER — LORAZEPAM 0.5 MG PO TABS: 0.5000 mg | ORAL_TABLET | Freq: Three times a day (TID) | ORAL | 1 refills | Status: DC | PRN

## 2020-09-15 MED ORDER — ESCITALOPRAM OXALATE 20 MG PO TABS: 20.0000 mg | ORAL_TABLET | Freq: Every day | ORAL | 0 refills | Status: DC

## 2020-09-15 NOTE — Progress Notes (Signed)
[facility]  Patient ID: LEVITA MONICAL,  MRN: [id number]  PCP: Asencion Gowda.August Saucer, MD  Date of Evaluation: 09/15/2020 Time spent:30 minutes  Chief Complaint:  Chief Complaint    Depression      HISTORY/CURRENT STATUS: HPI For routine med check.  "I feel unregulated." Has been more emotional and 'down' since the death of friends mother in law, when pt was sitting with her, last winter. Lynden checked on the lady and she was breathing and was fine, then when the lady didn't get up at her usual time, not even an hour later, she went in and checked on her and the lady was dead. Jermeka performed  CPR on her until paramedics arrived but she didn't survive. "I know it's not my fault. But I feel bad." (see note from 01/2020) But worse depression in past few months, to the point that she called out of work once and she never calls out of work. Decreased energy and motivation to do housework. Missed one week of med b/c didn't want to go to [employer]. After work, she wants to go home and do nothing, and usually does. Sleeps too much. Cries easily. No change in wt or appetite. No SI/HI.  Anxiety is worse. More generalized but does get nausea and was dry heaving the day she called out of work. 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  She has taken a couple of vacations since LOV, but she gets home and stress, anx/dep is still the same.   Anx/dep get worse the week before menses. Has always had a little PMS but it's gotten much worse in past several months.   Denies dizziness, syncope, seizures, numbness, tingling, tremor, tics, unsteady gait, slurred speech, confusion. Denies muscle or joint pain, stiffness, or dystonia.  Individual Medical History/ Review of Systems: Changes? :No    Past medications for mental health diagnoses include: Lexapro, Wellbutrin, Ativan  Allergies: Sulfa antibiotics  Current Medications:  Current Outpatient Medications:   .  Ascorbic Acid (VITAMIN C) 100 MG tablet, Take 100 mg by mouth daily., Disp: , Rfl:  .  Multiple Vitamin (MULTIVITAMIN) tablet, Take 1 tablet by mouth daily., Disp: , Rfl:  .  escitalopram (LEXAPRO) 20 MG tablet, Take 1 tablet (20 mg total) by mouth daily., Disp: 90 tablet, Rfl: 0 .  LORazepam (ATIVAN) 0.5 MG tablet, Take 1 tablet (0.5 mg total) by mouth every 8 (eight) hours as needed for anxiety., Disp: 30 tablet, Rfl: 1 Medication Side Effects: none  Family Medical/ Social History: Changes? No  MENTAL HEALTH EXAM:  There were no vitals taken for this visit.There is no height or weight on file to calculate BMI.  General Appearance: Casual, Neat and Well Groomed  Eye Contact:  Good  Speech:  Clear and Coherent  Volume:  Normal  Mood:  Depressed  Affect:  Depressed  Thought Process:  Goal Directed and Descriptions of Associations: Intact  Orientation:  Full (Time, Place, and Person)  Thought Content: Logical   Suicidal Thoughts:  No  Homicidal Thoughts:  No  Memory:  WNL  Judgement:  Good  Insight:  Good  Psychomotor Activity:  Normal  Concentration:  Concentration: Good  Recall:  Good  Fund of Knowledge: Good  Language: Good  Assets:  Desire for Improvement  ADL's:  Intact  Cognition: WNL  Prognosis:  Good    DIAGNOSES:    ICD-10-CM   1. Major depressive disorder, recurrent episode, moderate (HCC)  F33.1  2. Generalized anxiety disorder  F41.1   3. PMDD (premenstrual dysphoric disorder)  F32.81     Receiving Psychotherapy: No    RECOMMENDATIONS:  PDMP reviewed. I provided 30 minutes of face to face time during this encounter. Disc dx and treatment options. Recommend increasing Lexapro to regulate the serotonin. Also disc PMDD. We may need to add small amt of SSRI the 5 days or so before menses, but we'll make 1 change at a time.  Increase Lexapro to 20 mg qd.  Restart Ativan 0.5 mg, 1 po q8h prn anxiety.  Continue multivitamin, vitamin C. Recommend  counseling. (She lives in [facility] and drive here is around 45 mins.  Will see if she can find a counselor there or in [place] or [place], [place]) Return in 4 weeks.  Melony Overly, PA-C

## 2020-10-20 ENCOUNTER — Encounter: Payer: Self-pay | Admitting: Physician Assistant

## 2020-10-20 ENCOUNTER — Other Ambulatory Visit: Payer: Self-pay

## 2020-10-20 ENCOUNTER — Ambulatory Visit (INDEPENDENT_AMBULATORY_CARE_PROVIDER_SITE_OTHER): Payer: BLUE CROSS/BLUE SHIELD | Admitting: Physician Assistant

## 2020-10-20 DIAGNOSIS — F331 Major depressive disorder, recurrent, moderate: Secondary | ICD-10-CM | POA: Diagnosis not present

## 2020-10-20 DIAGNOSIS — F3281 Premenstrual dysphoric disorder: Secondary | ICD-10-CM

## 2020-10-20 DIAGNOSIS — F411 Generalized anxiety disorder: Secondary | ICD-10-CM | POA: Diagnosis not present

## 2020-10-20 DIAGNOSIS — F4310 Post-traumatic stress disorder, unspecified: Secondary | ICD-10-CM | POA: Diagnosis not present

## 2020-10-20 NOTE — Progress Notes (Signed)
[facility]  Patient ID: Jean Bailey,  MRN: [id number]  PCP: Asencion Gowda.August Saucer, MD  Date of Evaluation: 10/20/2020 Time spent:20 minutes  Chief Complaint:  Chief Complaint    Anxiety; Depression      HISTORY/CURRENT STATUS:  Not much better since we increased the Lexapro. Still frustrated with work and patients are difficult.  She gets irritable easier. Wants to stay home a lot and not do much after work.  Energy and motivation are fair to good most of the time.  States that she has never been a particularly happy person.  But she has to "fake it" more now.  She sleeps okay.  Doesn't cry easily. No SI/HI.    Not having n/v or dry-heaving like she reported at the last visit.  So the anxiety is some better.  She has only taken the Ativan a few times since she was here last.  It has been helpful.  Denies dizziness, syncope, seizures, numbness, tingling, tremor, tics, unsteady gait, slurred speech, confusion. Denies muscle or joint pain, stiffness, or dystonia.  Individual Medical History/ Review of Systems: Changes? :No    Past medications for mental health diagnoses include: Lexapro, Wellbutrin, Ativan  Allergies: Sulfa antibiotics  Current Medications:  Current Outpatient Medications:    Ascorbic Acid (VITAMIN C) 100 MG tablet, Take 100 mg by mouth daily., Disp: , Rfl:    escitalopram (LEXAPRO) 20 MG tablet, Take 1 tablet (20 mg total) by mouth daily., Disp: 90 tablet, Rfl: 0   LORazepam (ATIVAN) 0.5 MG tablet, Take 1 tablet (0.5 mg total) by mouth every 8 (eight) hours as needed for anxiety., Disp: 30 tablet, Rfl: 1   Multiple Vitamin (MULTIVITAMIN) tablet, Take 1 tablet by mouth daily., Disp: , Rfl:  Medication Side Effects: none  Family Medical/ Social History: Changes? No  MENTAL HEALTH EXAM:  There were no vitals taken for this visit.There is no height or weight on file to calculate BMI.  General Appearance: Casual, Neat and Well Groomed  Eye  Contact:  Good  Speech:  Clear and Coherent  Volume:  Normal  Mood:  Seems to be feeling better than at the last visit but still mildly depressed.  Affect:  Depressed  Thought Process:  Goal Directed and Descriptions of Associations: Intact  Orientation:  Full (Time, Place, and Person)  Thought Content: Logical   Suicidal Thoughts:  No  Homicidal Thoughts:  No  Memory:  WNL  Judgement:  Good  Insight:  Good  Psychomotor Activity:  Normal  Concentration:  Concentration: Good  Recall:  Good  Fund of Knowledge: Good  Language: Good  Assets:  Desire for Improvement  ADL's:  Intact  Cognition: WNL  Prognosis:  Good    DIAGNOSES:    ICD-10-CM   1. Major depressive disorder, recurrent episode, moderate (HCC)  F33.1   2. Generalized anxiety disorder  F41.1   3. PTSD (post-traumatic stress disorder)  F43.10   4. PMDD (premenstrual dysphoric disorder)  F32.81     Receiving Psychotherapy: No    RECOMMENDATIONS:  PDMP reviewed. I provided 20 minutes of face-to-face time during this encounter. We agreed to stay at the same Lexapro dose for now.  But can increase it after 2 weeks if she is still not feeling more motivation, more energy, and in general less depressed.  If after 6 weeks that is not helpful, I recommend weaning off Lexapro and switching to Cymbalta.  She verbalizes understanding.  Continue Lexapro 20 mg, 1 p.o. daily. Continue  Ativan 0.5 mg, 1 po q8h prn anxiety.  Continue multivitamin, vitamin D, B complex. She drives almost an hour to get to our office and ask if we could spread the visits out further.  That is fine as long as she calls if she has problems. Recommend counseling. Return in 2-3 months  Melony Overly, [place]

## 2020-10-21 DIAGNOSIS — F3281 Premenstrual dysphoric disorder: Secondary | ICD-10-CM | POA: Insufficient documentation

## 2020-10-21 DIAGNOSIS — F4310 Post-traumatic stress disorder, unspecified: Secondary | ICD-10-CM | POA: Insufficient documentation

## 2021-01-10 ENCOUNTER — Other Ambulatory Visit: Payer: Self-pay | Admitting: Physician Assistant

## 2021-01-19 ENCOUNTER — Other Ambulatory Visit: Payer: Self-pay

## 2021-01-19 ENCOUNTER — Ambulatory Visit (INDEPENDENT_AMBULATORY_CARE_PROVIDER_SITE_OTHER): Payer: 59 | Admitting: Physician Assistant

## 2021-01-19 ENCOUNTER — Encounter: Payer: Self-pay | Admitting: Physician Assistant

## 2021-01-19 DIAGNOSIS — F411 Generalized anxiety disorder: Secondary | ICD-10-CM | POA: Diagnosis not present

## 2021-01-19 DIAGNOSIS — F331 Major depressive disorder, recurrent, moderate: Secondary | ICD-10-CM | POA: Diagnosis not present

## 2021-01-19 MED ORDER — DESVENLAFAXINE SUCCINATE ER 50 MG PO TB24: 50.0000 mg | ORAL_TABLET | Freq: Every day | ORAL | 1 refills | Status: DC

## 2021-01-19 NOTE — Progress Notes (Signed)
[facility]  Patient ID: Jean Bailey,  MRN: [id number]  PCP: Asencion Gowda.August Saucer, MD  Date of Evaluation: 01/19/2021 Time spent:20 minutes  Chief Complaint:  Chief Complaint    Anxiety; Depression      HISTORY/CURRENT STATUS:  Not doing well with the depression. She feels apathetic. 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  No patience. Feels like a 'crabby human.' Not anxious. She increased Lex to 30 mg for about 3 weeks or so after our last visit and it didn't help so she went back to 20 mg. Sleeps well most of the time. No SI/HI.  Patient denies increased energy with decreased need for sleep, no increased talkativeness, no racing thoughts, no impulsivity or risky behaviors, no increased spending, no increased libido, no grandiosity, no increased irritability or anger, and no hallucinations.  Denies dizziness, syncope, seizures, numbness, tingling, tremor, tics, unsteady gait, slurred speech, confusion. Denies muscle or joint pain, stiffness, or dystonia.  Individual Medical History/ Review of Systems: Changes? :No    Past medications for mental health diagnoses include: Lexapro, Wellbutrin increased  Ativan  Allergies: Sulfa antibiotics  Current Medications:  Current Outpatient Medications:  .  Ascorbic Acid (VITAMIN C) 100 MG tablet, Take 100 mg by mouth daily., Disp: , Rfl:  .  desvenlafaxine (PRISTIQ) 50 MG 24 hr tablet, Take 1 tablet (50 mg total) by mouth daily., Disp: 30 tablet, Rfl: 1 .  LORazepam (ATIVAN) 0.5 MG tablet, Take 1 tablet (0.5 mg total) by mouth every 8 (eight) hours as needed for anxiety., Disp: 30 tablet, Rfl: 1 .  Multiple Vitamin (MULTIVITAMIN) tablet, Take 1 tablet by mouth daily., Disp: , Rfl:  Medication Side Effects: none  Family Medical/ Social History: Changes? No  MENTAL HEALTH EXAM:  There were no vitals taken for this visit.There is no height or weight on file to calculate BMI.  General Appearance:  Casual, Neat and Well Groomed  Eye Contact:  Good  Speech:  Clear and Coherent  Volume:  Normal  Mood:  Depressed  Affect:  Depressed  Thought Process:  Goal Directed and Descriptions of Associations: Intact  Orientation:  Full (Time, Place, and Person)  Thought Content: Logical   Suicidal Thoughts:  No  Homicidal Thoughts:  No  Memory:  WNL  Judgement:  Good  Insight:  Good  Psychomotor Activity:  Normal  Concentration:  Concentration: Good and Attention Span: Good  Recall:  Good  Fund of Knowledge: Good  Language: Good  Assets:  Desire for Improvement  ADL's:  Intact  Cognition: WNL  Prognosis:  Good    DIAGNOSES:    ICD-10-CM   1. Major depressive disorder, recurrent episode, moderate (HCC)  F33.1   2. Generalized anxiety disorder  F41.1     Receiving Psychotherapy: No    RECOMMENDATIONS:  PDMP reviewed. I provided 20 minutes of face-to-face time during this encounter. Wean off Lexapro by taking 10 mg 1 p.o. daily for 5 days and then stop.  At the same time will start Pristiq.  We discussed the benefits, risk and side effects and she accepts. Start Pristiq 50 mg, 1 p.o. daily. Continue Ativan 0.5 mg, 1 po q8h prn anxiety.  She rarely takes. Continue multivitamin, vitamin D, B complex. Return in 6 weeks.  Melony Overly, PA-C

## 2021-01-19 NOTE — Patient Instructions (Signed)
Wean off Lexapro by taking 10 mg daily for 5 days. Then stop. At the same time, start the Pristiq.

## 2021-03-23 ENCOUNTER — Other Ambulatory Visit: Payer: Self-pay

## 2021-03-23 ENCOUNTER — Ambulatory Visit (INDEPENDENT_AMBULATORY_CARE_PROVIDER_SITE_OTHER): Payer: 59 | Admitting: Physician Assistant

## 2021-03-23 ENCOUNTER — Encounter: Payer: Self-pay | Admitting: Physician Assistant

## 2021-03-23 DIAGNOSIS — F411 Generalized anxiety disorder: Secondary | ICD-10-CM

## 2021-03-23 DIAGNOSIS — F3342 Major depressive disorder, recurrent, in full remission: Secondary | ICD-10-CM

## 2021-03-23 MED ORDER — DESVENLAFAXINE SUCCINATE ER 50 MG PO TB24: 50.0000 mg | ORAL_TABLET | Freq: Every day | ORAL | 1 refills | Status: DC

## 2021-03-23 NOTE — Progress Notes (Signed)
[facility]  Patient ID: ADIYAH LAME,  MRN: [id number]  PCP: Asencion Gowda.August Saucer, MD  Date of Evaluation: 03/23/2021 Time spent:20 minutes  Chief Complaint:  Chief Complaint    Anxiety; Depression; Follow-up      HISTORY/CURRENT STATUS: HPI For routine med check  Feels better since starting the Pristiq! Enjoys things. Is more motivated and accomplishes more. Having more energy. Going to Zumba class more consistently. Not crying easily. Sleeps pretty well. 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  Does not have anxiety very often but when she does the Ativan helps.  Individual Medical History/ Review of Systems: Changes? :No    Past medications for mental health diagnoses include: Lexapro, Wellbutrin increased  Ativan  Allergies: Sulfa antibiotics  Current Medications:  Current Outpatient Medications:  .  Ascorbic Acid (VITAMIN C) 100 MG tablet, Take 100 mg by mouth daily., Disp: , Rfl:  .  LORazepam (ATIVAN) 0.5 MG tablet, Take 1 tablet (0.5 mg total) by mouth every 8 (eight) hours as needed for anxiety., Disp: 30 tablet, Rfl: 1 .  Multiple Vitamin (MULTIVITAMIN) tablet, Take 1 tablet by mouth daily., Disp: , Rfl:  .  desvenlafaxine (PRISTIQ) 50 MG 24 hr tablet, Take 1 tablet (50 mg total) by mouth daily., Disp: 90 tablet, Rfl: 1 Medication Side Effects: none  Family Medical/ Social History: Changes? No  MENTAL HEALTH EXAM:  There were no vitals taken for this visit.There is no height or weight on file to calculate BMI.  General Appearance: Casual and Well Groomed  Eye Contact:  Good  Speech:  Clear and Coherent and Normal Rate  Volume:  Normal  Mood:  Euthymic  Affect:  Appropriate  Thought Process:  Goal Directed and Descriptions of Associations: Circumstantial  Orientation:  Full (Time, Place, and Person)  Thought Content: Logical   Suicidal Thoughts:  No  Homicidal Thoughts:  No  Memory:  WNL  Judgement:  Good  Insight:  Good  Psychomotor Activity:   Normal  Concentration:  Concentration: Good  Recall:  Good  Fund of Knowledge: Good  Language: Good  Assets:  Desire for Improvement  ADL's:  Intact  Cognition: WNL  Prognosis:  Good    DIAGNOSES:    ICD-10-CM   1. Recurrent major depressive disorder, in full remission (HCC)  F33.42   2. Generalized anxiety disorder  F41.1     Receiving Psychotherapy: No    RECOMMENDATIONS:  PDMP was reviewed. I provided 20 minutes of face-to-face time during this encounter, including time spent in records review and charting. I am glad to see she is doing better so no changes will be made. Continue Pristiq 50 mg, 1 p.o. daily. Continue Ativan 0.5 mg, 1 p.o. 3 times daily as needed. Continue multivitamin. Return in 3 months.   Melony Overly, PA-C

## 2021-06-01 ENCOUNTER — Ambulatory Visit
Admission: RE | Admit: 2021-06-01 | Discharge: 2021-06-01 | Disposition: A | Discharge status: Home / Self Care | Payer: 59 | Source: Ambulatory Visit | Attending: Emergency Medicine | Admitting: Emergency Medicine

## 2021-06-01 VITALS — BP 120/86 | HR 84 | Temp 99.1°F | Resp 18

## 2021-06-01 DIAGNOSIS — H938X3 Other specified disorders of ear, bilateral: Secondary | ICD-10-CM

## 2021-06-01 DIAGNOSIS — H9203 Otalgia, bilateral: Secondary | ICD-10-CM

## 2021-06-01 MED ORDER — PREDNISONE 20 MG PO TABS: 20.0000 mg | ORAL_TABLET | Freq: Two times a day (BID) | ORAL | 0 refills | Status: AC

## 2021-06-01 MED ORDER — CIPROFLOXACIN-DEXAMETHASONE 0.3-0.1 % OT SUSP: 4.0000 [drp] | Freq: Two times a day (BID) | OTIC | 0 refills | Status: AC

## 2021-06-01 NOTE — ED Triage Notes (Signed)
Pt was tx last month with eardrops for swimmers ear. She reports some improvement, but sxs never resolved. Pt was then tx with antibiotics, which she finished 5 days ago.  Today, Pt reports sxs are still better, but still ongoing. She c/o fullness of bilateral ears with decreased hearing. Left ear sxs are greater than right.

## 2021-06-01 NOTE — Discharge Instructions (Addendum)
Rest and drink plenty of fluids Prescribed ciprofloxacin ear drops Prednisone prescribed.  Take as directed and to completion Take medications as directed and to completion Continue to use OTC ibuprofen and/ or tylenol as needed for pain control Follow up with ENT Return here or go to the ER if you have any new or worsening symptoms

## 2021-06-01 NOTE — ED Provider Notes (Signed)
[facility]   [id number] 06/01/21 Arrival Time: 1338  CC:EAR PAIN  SUBJECTIVE: History from: patient.  Jean Bailey is a 35 y.o. female who presents with of intermittent bilateral ear pain and fullness for the past month  Denies a precipitating event, such as swimming or wearing ear plugs.  Patient has tried ear drops and antibiotic with temporary relief.  Symptoms are made worse with lying down.  Report hx of ear infections as a child.  Reports decreased hearing.  Denies fever, chills, fatigue, sinus pain, rhinorrhea, ear discharge, sore throat, SOB, wheezing, chest pain, nausea, changes in bowel or bladder habits.    ROS: As per HPI.  All other pertinent ROS negative.     Past Medical History:  Diagnosis Date   Abnormal pap    Chronic headaches    Dysmenorrhea    Irregular menses    Past Surgical History:  Procedure Laterality Date   ADENOIDECTOMY     WISDOM TOOTH EXTRACTION     Allergies  Allergen Reactions   Sulfa Antibiotics Hives   No current facility-administered medications on file prior to encounter.   Current Outpatient Medications on File Prior to Encounter  Medication Sig Dispense Refill   Ascorbic Acid (VITAMIN C) 100 MG tablet Take 100 mg by mouth daily.     LORazepam (ATIVAN) 0.5 MG tablet Take 1 tablet (0.5 mg total) by mouth every 8 (eight) hours as needed for anxiety. 30 tablet 1   Multiple Vitamin (MULTIVITAMIN) tablet Take 1 tablet by mouth daily.     Social History   Socioeconomic History   Marital status: Single    Spouse name: Not on file   Number of children: Not on file   Years of education: Not on file   Highest education level: Not on file  Occupational History   Not on file  Tobacco Use   Smoking status: Never   Smokeless tobacco: Never  Substance and Sexual Activity   Alcohol use: Yes    Comment: socially   Drug use: No   Sexual activity: Never    Birth control/protection: None  Other Topics Concern   Not on file   Social History Narrative   Not on file   Social Determinants of Health   Financial Resource Strain: Not on file  Food Insecurity: Not on file  Transportation Needs: Not on file  Physical Activity: Not on file  Stress: Not on file  Social Connections: Not on file  Intimate Partner Violence: Not on file   Family History  Problem Relation Age of Onset   Hypertension Mother    Anemia Mother    Cancer Maternal Grandmother        lung cancer    OBJECTIVE:  Vitals:   06/01/21 1409  BP: 120/86  Pulse: 84  Resp: 18  Temp: 99.1 F (37.3 C)  TempSrc: Oral  SpO2: 98%    General appearance: alert; well-appearing, nontoxic; speaking in full sentences and tolerating own secretions HEENT: NCAT; Ears: EACs clear, TMs pearly gray; Eyes: PERRL.  EOM grossly intact.Nose: nares patent without rhinorrhea, Throat: oropharynx clear, tonsils non erythematous or enlarged, uvula midline  Neck: supple without LAD Lungs: unlabored respirations, symmetrical air entry; cough: absent; no respiratory distress; CTAB Heart: regular rate and rhythm.  Skin: warm and dry Psychological: alert and cooperative; normal mood and affect   ASSESSMENT & PLAN:  1. Acute ear pain, bilateral   2. Ear fullness, bilateral     Meds ordered this encounter  Medications   predniSONE (DELTASONE) 20 MG tablet    Sig: Take 1 tablet (20 mg total) by mouth 2 (two) times daily with a meal for 5 days.    Dispense:  10 tablet    Refill:  0    Order Specific Question:   Supervising Provider    Answer:   Eustace Moore [[id number]]   ciprofloxacin-dexamethasone (CIPRODEX) OTIC suspension    Sig: Place 4 drops into both ears 2 (two) times daily for 7 days.    Dispense:  7.5 mL    Refill:  0    Order Specific Question:   Supervising Provider    Answer:   Eustace Moore [[id number]]    Rest and drink plenty of fluids Prescribed ciprofloxacin ear drops Prednisone prescribed.  Take as directed and to completion Take  medications as directed and to completion Continue to use OTC ibuprofen and/ or tylenol as needed for pain control Follow up with ENT Return here or go to the ER if you have any new or worsening symptoms   Reviewed expectations re: course of current medical issues. Questions answered. Outlined signs and symptoms indicating need for more acute intervention. Patient verbalized understanding. After Visit Summary given.          Rennis Harding, PA-C 06/01/21 1431

## 2021-06-20 ENCOUNTER — Telehealth: Payer: Self-pay | Admitting: Physician Assistant

## 2021-06-20 NOTE — Telephone Encounter (Signed)
Please send Rf of Pristiq to [facility] pharmacy in [place], [place].-[facility] [address] [place] - [address]  [address] [place], [place] [address]

## 2021-06-21 ENCOUNTER — Other Ambulatory Visit: Payer: Self-pay | Admitting: Physician Assistant

## 2021-06-21 MED ORDER — DESVENLAFAXINE SUCCINATE ER 50 MG PO TB24: 50.0000 mg | ORAL_TABLET | Freq: Every day | ORAL | 6 refills | Status: DC

## 2021-06-21 NOTE — Telephone Encounter (Signed)
Prescription was sent

## 2021-06-21 NOTE — Telephone Encounter (Signed)
Pristiq noted in last office note 03/2021 but not on medication profile. Okay to send?

## 2021-06-22 ENCOUNTER — Ambulatory Visit: Payer: 59 | Admitting: Physician Assistant

## 2021-09-21 ENCOUNTER — Ambulatory Visit: Payer: 59 | Admitting: Physician Assistant

## 2022-03-01 ENCOUNTER — Ambulatory Visit (INDEPENDENT_AMBULATORY_CARE_PROVIDER_SITE_OTHER): Payer: 59 | Admitting: Physician Assistant

## 2022-03-01 ENCOUNTER — Encounter: Payer: Self-pay | Admitting: Physician Assistant

## 2022-03-01 DIAGNOSIS — F411 Generalized anxiety disorder: Secondary | ICD-10-CM

## 2022-03-01 DIAGNOSIS — F3342 Major depressive disorder, recurrent, in full remission: Secondary | ICD-10-CM | POA: Diagnosis not present

## 2022-03-01 MED ORDER — DESVENLAFAXINE SUCCINATE ER 50 MG PO TB24: 50.0000 mg | ORAL_TABLET | Freq: Every day | ORAL | 1 refills | Status: DC

## 2022-03-01 MED ORDER — LORAZEPAM 0.5 MG PO TABS: 0.5000 mg | ORAL_TABLET | Freq: Three times a day (TID) | ORAL | 1 refills | Status: DC | PRN

## 2022-03-01 NOTE — Progress Notes (Signed)
[facility] ? ?Patient ID: Jean Bailey,  ?MRN: [id number] ? ?PCP: Pcp, No ? ?Date of Evaluation: 03/01/2022 ?Time spent:20 minutes ? ?Chief Complaint:  ?Chief Complaint   ?Anxiety; Depression; Follow-up ?  ? ? ?HISTORY/CURRENT STATUS: ?HPI For routine med check Lost to f/u for almost a year. ? ?Feels pretty good for the most part. She missed several appts, but had meds so wasn't without. Occas missed pills "that's how it lasted as long." Didn't go off it on purpose to see how she would do off it.  ? ?Patient denies loss of interest in usual activities and is able to enjoy things.  Denies decreased energy or motivation.  Appetite has not changed.  No extreme sadness, tearfulness, or feelings of hopelessness. Sleeps well most of the time. Work is very busy (PT) but going well. Denies any changes in concentration, making decisions or remembering things. Does not have anxiety very often but when she does the Ativan helps. Denies suicidal or homicidal thoughts. ? ?Patient denies increased energy with decreased need for sleep, no increased talkativeness, no racing thoughts, no impulsivity or risky behaviors, no increased spending, no increased libido, no grandiosity, no increased irritability or anger, no paranoia, and no hallucinations. ? ?Denies dizziness, syncope, seizures, numbness, tingling, tremor, tics, unsteady gait, slurred speech, confusion. Denies muscle or joint pain, stiffness, or dystonia. ? ?Individual Medical History/ Review of Systems: Changes? :Yes    had covid in Dec. ?   ?Past medications for mental health diagnoses include: ?Lexapro, Wellbutrin increased anxiety,  Ativan ? ?Allergies: Sulfa antibiotics ? ?Current Medications:  ?Current Outpatient Medications:  ?  Ascorbic Acid (VITAMIN C) 100 MG tablet, Take 100 mg by mouth daily., Disp: , Rfl:  ?  Multiple Vitamin (MULTIVITAMIN) tablet, Take 1 tablet by mouth daily., Disp: , Rfl:  ?  desvenlafaxine (PRISTIQ) 50 MG 24 hr tablet, Take 1  tablet (50 mg total) by mouth daily., Disp: 90 tablet, Rfl: 1 ?  LORazepam (ATIVAN) 0.5 MG tablet, Take 1 tablet (0.5 mg total) by mouth every 8 (eight) hours as needed for anxiety., Disp: 30 tablet, Rfl: 1 ?Medication Side Effects: none ? ?Family Medical/ Social History: Changes? No ? ?MENTAL HEALTH EXAM: ? ?There were no vitals taken for this visit.There is no height or weight on file to calculate BMI.  ?General Appearance: Casual and Well Groomed  ?Eye Contact:  Good  ?Speech:  Clear and Coherent and Normal Rate  ?Volume:  Normal  ?Mood:  Euthymic  ?Affect:  Appropriate  ?Thought Process:  Goal Directed and Descriptions of Associations: Circumstantial  ?Orientation:  Full (Time, Place, and Person)  ?Thought Content: Logical   ?Suicidal Thoughts:  No  ?Homicidal Thoughts:  No  ?Memory:  WNL  ?Judgement:  Good  ?Insight:  Good  ?Psychomotor Activity:  Normal  ?Concentration:  Concentration: Good and Attention Span: Good  ?Recall:  Good  ?Fund of Knowledge: Good  ?Language: Good  ?Assets:  Desire for Improvement  ?ADL's:  Intact  ?Cognition: WNL  ?Prognosis:  Good  ? ? ?DIAGNOSES:  ?  ICD-10-CM   ?1. Recurrent major depressive disorder, in full remission (HCC)  F33.42   ?  ?2. Generalized anxiety disorder  F41.1   ?  ? ? ?Receiving Psychotherapy: No  ? ? ?RECOMMENDATIONS:  ?PDMP was reviewed. Ativan 09/15/2020. ?I provided 20 minutes of face to face time during this encounter, including time spent before and after the visit in records review, medical decision making, counseling pertinent to today's  visit, and charting.  ? ?Continue Pristiq 50 mg, 1 p.o. daily. ?Continue Ativan 0.5 mg, 1 p.o. 3 times daily as needed. ?Continue multivitamin. ?Return in 6 months. ? ?Melony Overly, PA-C  ?

## 2022-08-26 ENCOUNTER — Other Ambulatory Visit: Payer: Self-pay

## 2022-08-26 ENCOUNTER — Telehealth: Payer: Self-pay | Admitting: Physician Assistant

## 2022-08-26 MED ORDER — DESVENLAFAXINE SUCCINATE ER 50 MG PO TB24: 50.0000 mg | ORAL_TABLET | Freq: Every day | ORAL | 0 refills | Status: DC

## 2022-08-26 NOTE — Telephone Encounter (Signed)
Pt requesting new Rx for generic Pristiiq at [facility] [address]

## 2022-08-26 NOTE — Telephone Encounter (Signed)
Rx sent 

## 2022-08-30 ENCOUNTER — Ambulatory Visit: Payer: 59 | Admitting: Physician Assistant

## 2022-10-04 ENCOUNTER — Ambulatory Visit: Payer: Self-pay | Admitting: Physician Assistant

## 2022-11-08 ENCOUNTER — Ambulatory Visit: Payer: Self-pay | Admitting: Physician Assistant

## 2022-11-27 ENCOUNTER — Other Ambulatory Visit: Payer: Self-pay | Admitting: Physician Assistant

## 2022-12-13 DIAGNOSIS — Z111 Encounter for screening for respiratory tuberculosis: Secondary | ICD-10-CM | POA: Diagnosis not present

## 2022-12-15 DIAGNOSIS — Z0279 Encounter for issue of other medical certificate: Secondary | ICD-10-CM | POA: Diagnosis not present

## 2023-01-03 ENCOUNTER — Ambulatory Visit (INDEPENDENT_AMBULATORY_CARE_PROVIDER_SITE_OTHER): Payer: 59 | Admitting: Physician Assistant

## 2023-01-03 ENCOUNTER — Encounter: Payer: Self-pay | Admitting: Physician Assistant

## 2023-01-03 DIAGNOSIS — F411 Generalized anxiety disorder: Secondary | ICD-10-CM | POA: Diagnosis not present

## 2023-01-03 DIAGNOSIS — F3281 Premenstrual dysphoric disorder: Secondary | ICD-10-CM

## 2023-01-03 DIAGNOSIS — F3341 Major depressive disorder, recurrent, in partial remission: Secondary | ICD-10-CM

## 2023-01-03 DIAGNOSIS — R69 Illness, unspecified: Secondary | ICD-10-CM | POA: Diagnosis not present

## 2023-01-03 MED ORDER — SERTRALINE HCL 25 MG PO TABS: ORAL_TABLET | ORAL | 0 refills | Status: DC

## 2023-01-03 NOTE — Progress Notes (Signed)
[facility]  Patient ID: Jean Bailey,  MRN: [id number]  PCP: Pcp, No  Date of Evaluation: 01/03/2023 Time spent:20 minutes  Chief Complaint:  Chief Complaint   Anxiety; Depression; Follow-up    HISTORY/CURRENT STATUS: HPI  Lost to f/u for almost a year.  Under a lot of stress at work. Also her Mom had a surgery with complications so that's been stressful too. Gets anxious w/ all that, occas takes the Ativan which helps, but doesn't take it often. Main issue now is PMS, for about 3 days before she starts, more emotional all the way around, gets irritable.  Her menses is regular.  If it was not problems with her menstrual cycle she feels like her medications would be working well.  Patient is able to enjoy things.  Energy and motivation are good.   No extreme sadness, tearfulness, or feelings of hopelessness.  Sleeps well most of the time. ADLs and personal hygiene are normal.   Denies any changes in concentration, making decisions, or remembering things.  Appetite has not changed.  Weight is stable.  Denies suicidal or homicidal thoughts.  Patient denies increased energy with decreased need for sleep, increased talkativeness, racing thoughts, impulsivity or risky behaviors, increased spending, increased libido, grandiosity, increased irritability or anger, paranoia, or hallucinations.  Denies dizziness, syncope, seizures, numbness, tingling, tremor, tics, unsteady gait, slurred speech, confusion. Denies muscle or joint pain, stiffness, or dystonia.  Individual Medical History/ Review of Systems: Changes? :Yes       Past medications for mental health diagnoses include: Lexapro, Wellbutrin increased anxiety,  Ativan  Allergies: Sulfa antibiotics  Current Medications:  Current Outpatient Medications:    Ascorbic Acid (VITAMIN C) 100 MG tablet, Take 100 mg by mouth daily., Disp: , Rfl:    desvenlafaxine (PRISTIQ) 50 MG 24 hr tablet, Take 1 tablet by mouth once daily, Disp:  90 tablet, Rfl: 0   LORazepam (ATIVAN) 0.5 MG tablet, Take 1 tablet (0.5 mg total) by mouth every 8 (eight) hours as needed for anxiety., Disp: 30 tablet, Rfl: 1   Multiple Vitamin (MULTIVITAMIN) tablet, Take 1 tablet by mouth daily., Disp: , Rfl:    sertraline (ZOLOFT) 25 MG tablet, Begin taking 1 pill daily for 5 days before menses, then stop when menses begins., Disp: 30 tablet, Rfl: 0 Medication Side Effects: none  Family Medical/ Social History: Changes? No  MENTAL HEALTH EXAM:  There were no vitals taken for this visit.There is no height or weight on file to calculate BMI.  General Appearance: Casual and Well Groomed  Eye Contact:  Good  Speech:  Clear and Coherent and Normal Rate  Volume:  Normal  Mood:  Euthymic  Affect:  Appropriate  Thought Process:  Goal Directed and Descriptions of Associations: Circumstantial  Orientation:  Full (Time, Place, and Person)  Thought Content: Logical   Suicidal Thoughts:  No  Homicidal Thoughts:  No  Memory:  WNL  Judgement:  Good  Insight:  Good  Psychomotor Activity:  Normal  Concentration:  Concentration: Good and Attention Span: Good  Recall:  Good  Fund of Knowledge: Good  Language: Good  Assets:  Desire for Improvement  ADL's:  Intact  Cognition: WNL  Prognosis:  Good   DIAGNOSES:    ICD-10-CM   1. PMDD (premenstrual dysphoric disorder)  F32.81     2. Generalized anxiety disorder  F41.1     3. Depression, major, recurrent, in partial remission ([place])  F33.41       Receiving  Psychotherapy: No   RECOMMENDATIONS:  PDMP was reviewed. Ativan filled 03/01/2022. I provided 20 minutes of face to face time during this encounter, including time spent before and after the visit in records review, medical decision making, counseling pertinent to today's visit, and charting.   Discussed PMDD and treatment with Zoloft.  I explained the physiology of the estrogen/serotonin relationship, explained how to take it.  She would like to try  it.  Side effects were discussed and she accepts.  Continue Pristiq 50 mg, 1 p.o. daily. Continue Ativan 0.5 mg, 1 p.o. 3 times daily as needed. Start Zoloft 25 mg, 1 p.o. daily beginning 5 days before her menses begins and once she starts, stop the Zoloft. Continue multivitamin. Return in 2 months.  Donnal Moat, PA-C

## 2023-03-04 ENCOUNTER — Other Ambulatory Visit: Payer: Self-pay

## 2023-03-04 MED ORDER — DESVENLAFAXINE SUCCINATE ER 50 MG PO TB24: 50.0000 mg | ORAL_TABLET | Freq: Every day | ORAL | 0 refills | Status: DC

## 2023-03-07 ENCOUNTER — Ambulatory Visit: Payer: 59 | Admitting: Physician Assistant

## 2023-03-10 ENCOUNTER — Other Ambulatory Visit: Payer: Self-pay | Admitting: Physician Assistant

## 2023-03-14 ENCOUNTER — Encounter: Payer: Self-pay | Admitting: Physician Assistant

## 2023-03-14 ENCOUNTER — Ambulatory Visit: Payer: 59 | Admitting: Physician Assistant

## 2023-03-14 DIAGNOSIS — F3281 Premenstrual dysphoric disorder: Secondary | ICD-10-CM

## 2023-03-14 DIAGNOSIS — F411 Generalized anxiety disorder: Secondary | ICD-10-CM

## 2023-03-14 DIAGNOSIS — F3342 Major depressive disorder, recurrent, in full remission: Secondary | ICD-10-CM | POA: Diagnosis not present

## 2023-03-14 MED ORDER — SERTRALINE HCL 25 MG PO TABS: ORAL_TABLET | ORAL | 0 refills | Status: DC

## 2023-03-14 MED ORDER — LORAZEPAM 0.5 MG PO TABS: 0.5000 mg | ORAL_TABLET | Freq: Three times a day (TID) | ORAL | 1 refills | Status: DC | PRN

## 2023-03-14 NOTE — Progress Notes (Signed)
[facility]  Patient ID: Jean Bailey,  MRN: [id number]  PCP: Pcp, No  Date of Evaluation: 03/14/2023 Time spent:20 minutes  Chief Complaint:  Chief Complaint   Anxiety; Depression; Follow-up    HISTORY/CURRENT STATUS: HPI  For routine f/u  We added Zoloft for PMDD 2 months ago. She's had 2 periods since then. The       Denies dizziness, syncope, seizures, numbness, tingling, tremor, tics, unsteady gait, slurred speech, confusion. Denies muscle or joint pain, stiffness, or dystonia.  Individual Medical History/ Review of Systems: Changes? :Yes       Past medications for mental health diagnoses include: Lexapro, Wellbutrin increased anxiety,  Ativan  Allergies: Sulfa antibiotics  Current Medications:  Current Outpatient Medications:    Ascorbic Acid (VITAMIN C) 100 MG tablet, Take 100 mg by mouth daily., Disp: , Rfl:    desvenlafaxine (PRISTIQ) 50 MG 24 hr tablet, Take 1 tablet (50 mg total) by mouth daily., Disp: 90 tablet, Rfl: 0   Multiple Vitamin (MULTIVITAMIN) tablet, Take 1 tablet by mouth daily., Disp: , Rfl:    LORazepam (ATIVAN) 0.5 MG tablet, Take 1 tablet (0.5 mg total) by mouth every 8 (eight) hours as needed for anxiety., Disp: 30 tablet, Rfl: 1   sertraline (ZOLOFT) 25 MG tablet, Begin taking 1 pill daily for 5-7 days before menses, then stop when menses begins., Disp: 90 tablet, Rfl: 0 Medication Side Effects: none  Family Medical/ Social History: Changes? No  MENTAL HEALTH EXAM:  There were no vitals taken for this visit.There is no height or weight on file to calculate BMI.  General Appearance: Casual and Well Groomed  Eye Contact:  Good  Speech:  Clear and Coherent and Normal Rate  Volume:  Normal  Mood:  Euthymic  Affect:  Appropriate  Thought Process:  Goal Directed and Descriptions of Associations: Circumstantial  Orientation:  Full (Time, Place, and Person)  Thought Content: Logical   Suicidal Thoughts:  No  Homicidal  Thoughts:  No  Memory:  WNL  Judgement:  Good  Insight:  Good  Psychomotor Activity:  Normal  Concentration:  Concentration: Good and Attention Span: Good  Recall:  Good  Fund of Knowledge: Good  Language: Good  Assets:  Desire for Improvement  ADL's:  Intact  Cognition: WNL  Prognosis:  Good   DIAGNOSES:  No diagnosis found.  Receiving Psychotherapy: No   RECOMMENDATIONS:  PDMP was reviewed. Ativan filled 03/01/2022. I provided  minutes of face to face time during this encounter, including time spent before and after the visit in records review, medical decision making, counseling pertinent to today's visit, and charting.    Continue Pristiq 50 mg, 1 p.o. daily. Continue Ativan 0.5 mg, 1 p.o. 3 times daily as needed. Continue Zoloft 25 mg, 1 p.o. daily beginning 5 days before her menses begins and once she starts, stop the Zoloft. Continue multivitamin. Return in 2 months.  Melony Overly, PA-C

## 2023-03-17 ENCOUNTER — Encounter: Payer: Self-pay | Admitting: Physician Assistant

## 2023-07-28 ENCOUNTER — Other Ambulatory Visit: Payer: Self-pay

## 2023-07-28 ENCOUNTER — Telehealth: Payer: Self-pay | Admitting: Physician Assistant

## 2023-07-28 MED ORDER — DESVENLAFAXINE SUCCINATE ER 50 MG PO TB24: 50.0000 mg | ORAL_TABLET | Freq: Every day | ORAL | 0 refills | Status: DC

## 2023-07-28 NOTE — Telephone Encounter (Signed)
Pt LVM @ 8:08a requesting generic Pristiq  To  [facility] [address].  She is going out of town Wed night and would like to pick it up before then.  Next appt 10/11

## 2023-07-28 NOTE — Telephone Encounter (Signed)
Sent!

## 2023-08-19 ENCOUNTER — Other Ambulatory Visit: Payer: Self-pay | Admitting: Physician Assistant

## 2023-09-12 ENCOUNTER — Encounter: Payer: Self-pay | Admitting: Physician Assistant

## 2023-09-12 ENCOUNTER — Ambulatory Visit (INDEPENDENT_AMBULATORY_CARE_PROVIDER_SITE_OTHER): Payer: 59 | Admitting: Physician Assistant

## 2023-09-12 DIAGNOSIS — F3342 Major depressive disorder, recurrent, in full remission: Secondary | ICD-10-CM

## 2023-09-12 DIAGNOSIS — F5105 Insomnia due to other mental disorder: Secondary | ICD-10-CM | POA: Diagnosis not present

## 2023-09-12 DIAGNOSIS — F411 Generalized anxiety disorder: Secondary | ICD-10-CM

## 2023-09-12 DIAGNOSIS — F99 Mental disorder, not otherwise specified: Secondary | ICD-10-CM | POA: Diagnosis not present

## 2023-09-12 DIAGNOSIS — F3281 Premenstrual dysphoric disorder: Secondary | ICD-10-CM

## 2023-09-12 MED ORDER — LORAZEPAM 0.5 MG PO TABS: 0.5000 mg | ORAL_TABLET | Freq: Three times a day (TID) | ORAL | 5 refills | Status: DC | PRN

## 2023-09-12 NOTE — Progress Notes (Signed)
[facility]  Patient ID: Jean Bailey,  MRN: [id number]  PCP: Pcp, No  Date of Evaluation: 09/12/2023 Time spent:20 minutes  Chief Complaint:  Chief Complaint   Anxiety; Depression; Follow-up    HISTORY/CURRENT STATUS: HPI  For routine f/u  Doing well. Feels like her meds are working well as far as mood goes. Patient is able to enjoy things.  Energy and motivation are good.  Work is going well.  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  No extreme sadness, tearfulness, or feelings of hopelessness.   ADLs and personal hygiene are normal.   Denies any changes in concentration, making decisions, or remembering things.  Appetite has not changed.  Weight is stable.  She has trouble falling asleep for the past month or so. She practices good sleep hygiene so not sure why she's not sleeping.  PMS sx are better since taking the Zoloft the week before she starts.  Generalized happens sometimes. Ativan helps. She's had to take it a little more often now with some of the changes and stressors she has going on with changing job locations. Denies suicidal or homicidal thoughts.  Patient denies increased energy with decreased need for sleep, increased talkativeness, racing thoughts, impulsivity or risky behaviors, increased spending, increased libido, grandiosity, increased irritability or anger, paranoia, or hallucinations.  Denies dizziness, syncope, seizures, numbness, tingling, tremor, tics, unsteady gait, slurred speech, confusion. Denies muscle or joint pain, stiffness, or dystonia.  Individual Medical History/ Review of Systems: Changes? :No       Past medications for mental health diagnoses include: Lexapro, Wellbutrin increased anxiety,  Ativan  Allergies: Sulfa antibiotics  Current Medications:  Current Outpatient Medications:    Ascorbic Acid (VITAMIN C) 100 MG tablet, Take 100 mg by mouth daily., Disp: , Rfl:    desvenlafaxine (PRISTIQ) 50 MG 24 hr  tablet, TAKE 1 TABLET BY MOUTH EVERY DAY, Disp: 90 tablet, Rfl: 1   Multiple Vitamin (MULTIVITAMIN) tablet, Take 1 tablet by mouth daily., Disp: , Rfl:    sertraline (ZOLOFT) 25 MG tablet, Begin taking 1 pill daily for 5-7 days before menses, then stop when menses begins., Disp: 90 tablet, Rfl: 0   LORazepam (ATIVAN) 0.5 MG tablet, Take 1 tablet (0.5 mg total) by mouth every 8 (eight) hours as needed for anxiety., Disp: 30 tablet, Rfl: 5 Medication Side Effects: none  Family Medical/ Social History: Changes? No  MENTAL HEALTH EXAM:  There were no vitals taken for this visit.There is no height or weight on file to calculate BMI.  General Appearance: Casual and Well Groomed  Eye Contact:  Good  Speech:  Clear and Coherent and Normal Rate  Volume:  Normal  Mood:  Euthymic  Affect:  Appropriate  Thought Process:  Goal Directed and Descriptions of Associations: Circumstantial  Orientation:  Full (Time, Place, and Person)  Thought Content: Logical   Suicidal Thoughts:  No  Homicidal Thoughts:  No  Memory:  WNL  Judgement:  Good  Insight:  Good  Psychomotor Activity:  Normal  Concentration:  Concentration: Good and Attention Span: Good  Recall:  Good  Fund of Knowledge: Good  Language: Good  Assets:  Desire for Improvement Financial Resources/Insurance Housing Transportation Vocational/Educational  ADL's:  Intact  Cognition: WNL  Prognosis:  Good   DIAGNOSES:    ICD-10-CM   1. Recurrent major depressive disorder, in full remission (HCC)  F33.42     2. PMDD (premenstrual dysphoric disorder)  F32.81  3. Generalized anxiety disorder  F41.1     4. Insomnia due to other mental disorder  F51.05    F99      Receiving Psychotherapy: No   RECOMMENDATIONS:  PDMP was reviewed. Ativan filled 03/14/2023. I provided 20  minutes of face to face time during this encounter, including time spent before and after the visit in records review, medical decision making, counseling  pertinent to today's visit, and charting.   She's doing well so no changes needed.   Continue Pristiq 50 mg, 1 p.o. daily. Continue Ativan 0.5 mg, 1 p.o. 3 times daily as needed. Continue Zoloft 25 mg, 1 p.o. daily beginning 5 days before her menses begins and once she starts, stop the Zoloft. Continue multivitamin. Return in 6 months.  Melony Overly, PA-C

## 2024-01-19 ENCOUNTER — Other Ambulatory Visit: Payer: Self-pay | Admitting: Physician Assistant

## 2024-01-26 DIAGNOSIS — M25551 Pain in right hip: Secondary | ICD-10-CM | POA: Diagnosis not present

## 2024-01-26 DIAGNOSIS — S3992XA Unspecified injury of lower back, initial encounter: Secondary | ICD-10-CM | POA: Diagnosis not present

## 2024-01-26 DIAGNOSIS — M25569 Pain in unspecified knee: Secondary | ICD-10-CM | POA: Diagnosis not present

## 2024-03-12 ENCOUNTER — Ambulatory Visit (INDEPENDENT_AMBULATORY_CARE_PROVIDER_SITE_OTHER): Payer: 59 | Admitting: Physician Assistant

## 2024-03-12 ENCOUNTER — Encounter: Payer: Self-pay | Admitting: Physician Assistant

## 2024-03-12 DIAGNOSIS — F411 Generalized anxiety disorder: Secondary | ICD-10-CM

## 2024-03-12 DIAGNOSIS — F3342 Major depressive disorder, recurrent, in full remission: Secondary | ICD-10-CM | POA: Diagnosis not present

## 2024-03-12 MED ORDER — SERTRALINE HCL 25 MG PO TABS: ORAL_TABLET | ORAL | 0 refills | Status: DC

## 2024-03-12 MED ORDER — DESVENLAFAXINE SUCCINATE ER 50 MG PO TB24: 50.0000 mg | ORAL_TABLET | Freq: Every day | ORAL | 0 refills | Status: DC

## 2024-03-12 NOTE — Progress Notes (Signed)
 [facility]  Patient ID: Jean Bailey,  MRN: [id number]  PCP: Pcp, No  Date of Evaluation: 03/12/2024 Time spent:20 minutes  Chief Complaint:  Chief Complaint   Anxiety; Depression; Follow-up    HISTORY/CURRENT STATUS: HPI  For routine f/u  Doing well. 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  Energy and motivation are good.  No extreme sadness, tearfulness, or feelings of hopelessness.  Sleeps well most of the time. ADLs and personal hygiene are normal.   Denies any changes in concentration, making decisions, or remembering things.  Appetite has not changed.  Weight is stable.  Anxiety is controlled. Rarely takes the Ativan but it does help when needed.  Denies suicidal or homicidal thoughts.  Patient denies increased energy with decreased need for sleep, increased talkativeness, racing thoughts, impulsivity or risky behaviors, increased spending, increased libido, grandiosity, increased irritability or anger, paranoia, or hallucinations.  Denies dizziness, syncope, seizures, numbness, tingling, tremor, tics, unsteady gait, slurred speech, confusion. Denies muscle or joint pain, stiffness, or dystonia.  Individual Medical History/ Review of Systems: Changes? :No       Past medications for mental health diagnoses include: Lexapro, Wellbutrin increased anxiety,  Ativan  Allergies: Sulfa antibiotics  Current Medications:  Current Outpatient Medications:    Ascorbic Acid (VITAMIN C) 100 MG tablet, Take 100 mg by mouth daily., Disp: , Rfl:    LORazepam (ATIVAN) 0.5 MG tablet, Take 1 tablet (0.5 mg total) by mouth every 8 (eight) hours as needed for anxiety., Disp: 30 tablet, Rfl: 5   Multiple Vitamin (MULTIVITAMIN) tablet, Take 1 tablet by mouth daily., Disp: , Rfl:    desvenlafaxine (PRISTIQ) 50 MG 24 hr tablet, Take 1 tablet (50 mg total) by mouth daily., Disp: 90 tablet, Rfl: 0   sertraline (ZOLOFT) 25 MG tablet, Begin taking 1 pill daily for 5-7  days before menses, then stop when menses begins., Disp: 90 tablet, Rfl: 0 Medication Side Effects: none  Family Medical/ Social History: Changes? No  MENTAL HEALTH EXAM:  There were no vitals taken for this visit.There is no height or weight on file to calculate BMI.  General Appearance: Casual and Well Groomed  Eye Contact:  Good  Speech:  Clear and Coherent and Normal Rate  Volume:  Normal  Mood:  Euthymic  Affect:  Congruent  Thought Process:  Goal Directed and Descriptions of Associations: Circumstantial  Orientation:  Full (Time, Place, and Person)  Thought Content: Logical   Suicidal Thoughts:  No  Homicidal Thoughts:  No  Memory:  WNL  Judgement:  Good  Insight:  Good  Psychomotor Activity:  Normal  Concentration:  Concentration: Good and Attention Span: Good  Recall:  Good  Fund of Knowledge: Good  Language: Good  Assets:  Desire for Improvement Financial Resources/Insurance Housing Transportation Vocational/Educational  ADL's:  Intact  Cognition: WNL  Prognosis:  Good   DIAGNOSES:    ICD-10-CM   1. Recurrent major depressive disorder, in full remission (HCC)  F33.42     2. Generalized anxiety disorder  F41.1      Receiving Psychotherapy: No   RECOMMENDATIONS:  PDMP was reviewed. Ativan filled 09/12/2023. I provided 20 minutes of face to face time during this encounter, including time spent before and after the visit in records review, medical decision making, counseling pertinent to today's visit, and charting.   Doing well on current meds so no changes.   Continue Pristiq 50 mg, 1 p.o. daily. Continue Ativan 0.5 mg,  1 p.o. 3 times daily as needed. Continue Zoloft 25 mg, 1 p.o. daily beginning 5 days before her menses begins and once she starts, stop the Zoloft. Continue multivitamin. Return in 6 months.  Melony Overly, PA-C

## 2024-07-09 ENCOUNTER — Other Ambulatory Visit: Payer: Self-pay | Admitting: Physician Assistant

## 2024-08-03 ENCOUNTER — Other Ambulatory Visit: Payer: Self-pay | Admitting: Physician Assistant

## 2024-09-10 ENCOUNTER — Ambulatory Visit (INDEPENDENT_AMBULATORY_CARE_PROVIDER_SITE_OTHER): Admitting: Physician Assistant

## 2024-09-10 ENCOUNTER — Encounter: Payer: Self-pay | Admitting: Physician Assistant

## 2024-09-10 DIAGNOSIS — F411 Generalized anxiety disorder: Secondary | ICD-10-CM

## 2024-09-10 DIAGNOSIS — F3281 Premenstrual dysphoric disorder: Secondary | ICD-10-CM

## 2024-09-10 DIAGNOSIS — F331 Major depressive disorder, recurrent, moderate: Secondary | ICD-10-CM

## 2024-09-10 MED ORDER — LORAZEPAM 0.5 MG PO TABS: 0.5000 mg | ORAL_TABLET | Freq: Three times a day (TID) | ORAL | 1 refills | Status: DC | PRN

## 2024-09-10 MED ORDER — SERTRALINE HCL 25 MG PO TABS: ORAL_TABLET | ORAL | 0 refills | Status: AC

## 2024-09-10 MED ORDER — DESVENLAFAXINE SUCCINATE ER 25 MG PO TB24: 75.0000 mg | ORAL_TABLET | Freq: Every day | ORAL | 1 refills | Status: DC

## 2024-09-10 NOTE — Progress Notes (Signed)
 [facility]  Patient ID: Jean Bailey,  MRN: [id number]  PCP: Pcp, No  Date of Evaluation: 09/10/2024 Time spent:20 minutes  Chief Complaint:  Chief Complaint   Anxiety; Depression; Follow-up     HISTORY/CURRENT STATUS: HPI  For routine f/u  Has been more anxious lately.  Not sure why, no PA, just feels overwhelmed. She takes time for herself, had a recent vacation but it was hard to relax for several reasons: she got stuck in the airport overnight, and a few other stressors. She's working but sometimes she doesn't want to get out of bed.  Is a PT.  Patient is able to enjoy things.  Energy and motivation are good.  No extreme sadness, tearfulness, or feelings of hopelessness.  Sleeps well most of the time. ADLs and personal hygiene are normal.   Denies any changes in concentration, making decisions, or remembering things.  Appetite has not changed.  Weight is stable.  No mania, delirium, AH/VH.  Feels like the Zoloft  for PMDD is helping like it should.  No SI/HI.  Individual Medical History/ Review of Systems: Changes? :No       Past medications for mental health diagnoses include: Lexapro , Wellbutrin  increased anxiety,  Ativan , Pristiq , Zoloft  for PMDD  Allergies: Sulfa antibiotics  Current Medications:  Current Outpatient Medications:    Ascorbic Acid (VITAMIN C) 100 MG tablet, Take 100 mg by mouth daily., Disp: , Rfl:    desvenlafaxine  (PRISTIQ ) 25 MG 24 hr tablet, Take 3 tablets (75 mg total) by mouth daily., Disp: 90 tablet, Rfl: 1   Multiple Vitamin (MULTIVITAMIN) tablet, Take 1 tablet by mouth daily., Disp: , Rfl:    LORazepam  (ATIVAN ) 0.5 MG tablet, Take 1 tablet (0.5 mg total) by mouth every 8 (eight) hours as needed for anxiety., Disp: 30 tablet, Rfl: 1   sertraline  (ZOLOFT ) 25 MG tablet, Begin taking 1 pill daily for 5-7 days before menses, then stop when menses begins., Disp: 90 tablet, Rfl: 0 Medication Side Effects: none  Family Medical/ Social  History: Changes? No  MENTAL HEALTH EXAM:  There were no vitals taken for this visit.There is no height or weight on file to calculate BMI.  General Appearance: Casual and Well Groomed  Eye Contact:  Good  Speech:  Clear and Coherent and Normal Rate  Volume:  Normal  Mood:  Euthymic  Affect:  Congruent  Thought Process:  Goal Directed and Descriptions of Associations: Circumstantial  Orientation:  Full (Time, Place, and Person)  Thought Content: Logical   Suicidal Thoughts:  No  Homicidal Thoughts:  No  Memory:  WNL  Judgement:  Good  Insight:  Good  Psychomotor Activity:  Normal  Concentration:  Concentration: Good and Attention Span: Good  Recall:  Good  Fund of Knowledge: Good  Language: Good  Assets:  Communication Skills Desire for Improvement Financial Resources/Insurance Housing Resilience Transportation Vocational/Educational  ADL's:  Intact  Cognition: WNL  Prognosis:  Good   DIAGNOSES:    ICD-10-CM   1. Major depressive disorder, recurrent episode, moderate (HCC)  F33.1     2. Generalized anxiety disorder  F41.1     3. PMDD (premenstrual dysphoric disorder)  F32.81       Receiving Psychotherapy: No   RECOMMENDATIONS:  PDMP was reviewed.  A small quantity of hydrocodone and Valium filled 03/16/2024.  Adderall filled 03/18/2023.  Ativan  filled 03/14/2023. I provided approximately 20 minutes of face to face time during this encounter, including time spent before and after the visit  in records review, medical decision making, counseling pertinent to today's visit, and charting.   Discussed the anxiety. I recommend increasing the Pristiq . Pros and cons discussed and she'd like to increase.   Increase Pristiq  25 mg, to 3 po every day. Continue Ativan  0.5 mg, 1 p.o. 3 times daily as needed. Continue Zoloft  25 mg, 1 p.o. daily beginning 5 days before her menses begins and once she starts, stop the Zoloft . Continue multivitamin. Return in 6 weeks.   Verneita Cooks, PA-C

## 2024-10-07 ENCOUNTER — Other Ambulatory Visit: Payer: Self-pay | Admitting: Physician Assistant

## 2024-10-22 ENCOUNTER — Encounter: Payer: Self-pay | Admitting: Physician Assistant

## 2024-10-22 ENCOUNTER — Ambulatory Visit (INDEPENDENT_AMBULATORY_CARE_PROVIDER_SITE_OTHER): Admitting: Physician Assistant

## 2024-10-22 DIAGNOSIS — F411 Generalized anxiety disorder: Secondary | ICD-10-CM

## 2024-10-22 DIAGNOSIS — F3341 Major depressive disorder, recurrent, in partial remission: Secondary | ICD-10-CM | POA: Diagnosis not present

## 2024-10-22 DIAGNOSIS — F3281 Premenstrual dysphoric disorder: Secondary | ICD-10-CM

## 2024-10-22 DIAGNOSIS — F5105 Insomnia due to other mental disorder: Secondary | ICD-10-CM | POA: Diagnosis not present

## 2024-10-22 MED ORDER — DESVENLAFAXINE SUCCINATE ER 25 MG PO TB24: 75.0000 mg | ORAL_TABLET | Freq: Every day | ORAL | 5 refills | Status: DC

## 2024-10-22 NOTE — Progress Notes (Signed)
 [facility]  Patient ID: Jean Bailey,  MRN: [id number]  PCP: Pcp, No  Date of Evaluation: 10/22/2024 Time spent:20 minutes  Chief Complaint:  Chief Complaint   Anxiety; Depression; Follow-up    HISTORY/CURRENT STATUS: HPI  For routine f/u  We increased the Pristiq  at LOV.  Has more energy and motivation now. She feels more like doing stuff in the evenings, when she hadn't been before the increase. She's not having PA but still gets overwhelmed sometimes, depending on what's going on.  Ativan  helps.  Is enjoying doing things with friends.  Energy and motivation are good.  Work is going well.   No extreme sadness, tearfulness, or feelings of hopelessness.  Sleeps well most of the time. ADLs and personal hygiene are normal.   Denies any changes in concentration, making decisions, or remembering things.  Appetite has not changed.  Weight is stable.    No mania, delirium, AH/VH.  No SI/HI.  Individual Medical History/ Review of Systems: Changes? :No       Past medications for mental health diagnoses include: Lexapro , Wellbutrin  increased anxiety,  Ativan , Pristiq , Zoloft  for PMDD  Allergies: Sulfa antibiotics  Current Medications:  Current Outpatient Medications:    Ascorbic Acid (VITAMIN C) 100 MG tablet, Take 100 mg by mouth daily., Disp: , Rfl:    LORazepam  (ATIVAN ) 0.5 MG tablet, Take 1 tablet (0.5 mg total) by mouth every 8 (eight) hours as needed for anxiety., Disp: 30 tablet, Rfl: 1   Multiple Vitamin (MULTIVITAMIN) tablet, Take 1 tablet by mouth daily., Disp: , Rfl:    sertraline  (ZOLOFT ) 25 MG tablet, Begin taking 1 pill daily for 5-7 days before menses, then stop when menses begins., Disp: 90 tablet, Rfl: 0   desvenlafaxine  (PRISTIQ ) 25 MG 24 hr tablet, Take 3 tablets (75 mg total) by mouth daily., Disp: 90 tablet, Rfl: 5 Medication Side Effects: none  Family Medical/ Social History: Changes? No  MENTAL HEALTH EXAM:  There were no vitals taken for this  visit.There is no height or weight on file to calculate BMI.  General Appearance: Casual and Well Groomed  Eye Contact:  Good  Speech:  Clear and Coherent and Normal Rate  Volume:  Normal  Mood:  Euthymic  Affect:  Congruent  Thought Process:  Goal Directed and Descriptions of Associations: Circumstantial  Orientation:  Full (Time, Place, and Person)  Thought Content: Logical   Suicidal Thoughts:  No  Homicidal Thoughts:  No  Memory:  WNL  Judgement:  Good  Insight:  Good  Psychomotor Activity:  Normal  Concentration:  Concentration: Good and Attention Span: Good  Recall:  Good  Fund of Knowledge: Good  Language: Good  Assets:  Communication Skills Desire for Improvement Financial Resources/Insurance Housing Resilience Social Support Transportation Vocational/Educational  ADL's:  Intact  Cognition: WNL  Prognosis:  Good   DIAGNOSES:    ICD-10-CM   1. Depression, major, recurrent, in partial remission  F33.41     2. Generalized anxiety disorder  F41.1     3. Insomnia due to other mental disorder  F51.05    F99     4. PMDD (premenstrual dysphoric disorder)  F32.81       Receiving Psychotherapy: No   RECOMMENDATIONS:  PDMP was reviewed.  Ativan  filled 09/10/2024. I provided approximately 20 minutes of face to face time during this encounter, including time spent before and after the visit in records review, medical decision making, counseling pertinent to today's visit, and charting.  I'm glad to see her doing better.  However, if needed, we can increase the pristiq  over the phone if needed before the next OV.    Continue Pristiq  25 mg, 3 po every day. Continue Ativan  0.5 mg, 1 p.o. 3 times daily as needed. Continue Zoloft  25 mg, 1 p.o. daily beginning 5 days before her menses begins and once she starts, stop the Zoloft . Continue multivitamin. Return in 2 months.   Verneita Cooks, PA-C

## 2024-11-01 ENCOUNTER — Other Ambulatory Visit: Payer: Self-pay | Admitting: Physician Assistant

## 2024-11-01 DIAGNOSIS — Z1231 Encounter for screening mammogram for malignant neoplasm of breast: Secondary | ICD-10-CM

## 2024-11-26 ENCOUNTER — Ambulatory Visit
Admission: RE | Admit: 2024-11-26 | Discharge: 2024-11-26 | Disposition: A | Discharge status: Home / Self Care | Source: Ambulatory Visit

## 2024-11-26 DIAGNOSIS — Z1231 Encounter for screening mammogram for malignant neoplasm of breast: Secondary | ICD-10-CM

## 2024-12-17 ENCOUNTER — Telehealth: Payer: Self-pay | Admitting: Physician Assistant

## 2024-12-17 DIAGNOSIS — F411 Generalized anxiety disorder: Secondary | ICD-10-CM

## 2024-12-17 DIAGNOSIS — F3341 Major depressive disorder, recurrent, in partial remission: Secondary | ICD-10-CM

## 2024-12-24 ENCOUNTER — Encounter: Payer: Self-pay | Admitting: Physician Assistant

## 2024-12-24 ENCOUNTER — Ambulatory Visit (INDEPENDENT_AMBULATORY_CARE_PROVIDER_SITE_OTHER): Admitting: Physician Assistant

## 2024-12-24 DIAGNOSIS — F411 Generalized anxiety disorder: Secondary | ICD-10-CM

## 2024-12-24 DIAGNOSIS — F3341 Major depressive disorder, recurrent, in partial remission: Secondary | ICD-10-CM

## 2024-12-24 DIAGNOSIS — F3281 Premenstrual dysphoric disorder: Secondary | ICD-10-CM

## 2024-12-24 MED ORDER — DESVENLAFAXINE SUCCINATE ER 25 MG PO TB24: 75.0000 mg | ORAL_TABLET | Freq: Every day | ORAL | 5 refills | Status: AC

## 2024-12-24 MED ORDER — LORAZEPAM 0.5 MG PO TABS: 0.5000 mg | ORAL_TABLET | Freq: Three times a day (TID) | ORAL | 5 refills | Status: AC | PRN

## 2024-12-24 NOTE — Progress Notes (Signed)
 "     [facility]  Patient ID: Jean Bailey,  MRN: [id number]  PCP: Pcp, No  Date of Evaluation: 12/24/2024 Time spent:20 minutes  Chief Complaint:  Chief Complaint   Anxiety; Depression; Follow-up    HISTORY/CURRENT STATUS: HPI  For routine f/u  We increased the Pristiq  a few months ago.  She feels 'pretty good.'  Patient is able to enjoy things.  Energy and motivation are good.  Does [place], spends time with friends.  Loves to read. Work is going well. Busy all the time, she's a PT.   No extreme sadness, tearfulness, or feelings of hopelessness.  Sleeps well most of the time. ADLs and personal hygiene are normal.   Denies any changes in concentration, making decisions, or remembering things.  Appetite has not changed.  Weight is stable.  No PA, does get overwhelmed occas, stressors w/ her Mom.  PMDD is well treated w/ Zoloft . No mania, delirium, AH/VH.  No SI/HI.  Individual Medical History/ Review of Systems: Changes? :No       Past medications for mental health diagnoses include: Lexapro , Wellbutrin  increased anxiety,  Ativan , Pristiq , Zoloft  for PMDD  Allergies: Sulfa antibiotics  Current Medications:  Current Outpatient Medications:    Ascorbic Acid (VITAMIN C) 100 MG tablet, Take 100 mg by mouth daily., Disp: , Rfl:    Multiple Vitamin (MULTIVITAMIN) tablet, Take 1 tablet by mouth daily., Disp: , Rfl:    sertraline  (ZOLOFT ) 25 MG tablet, Begin taking 1 pill daily for 5-7 days before menses, then stop when menses begins., Disp: 90 tablet, Rfl: 0   desvenlafaxine  (PRISTIQ ) 25 MG 24 hr tablet, Take 3 tablets (75 mg total) by mouth daily., Disp: 90 tablet, Rfl: 5   LORazepam  (ATIVAN ) 0.5 MG tablet, Take 1 tablet (0.5 mg total) by mouth every 8 (eight) hours as needed for anxiety., Disp: 30 tablet, Rfl: 5 Medication Side Effects: none  Family Medical/ Social History: Changes? Her roommate moved out before Christmas.  That's been good.   MENTAL HEALTH EXAM:  Last  menstrual period 10/27/2024.There is no height or weight on file to calculate BMI.  General Appearance: Casual, Neat, and Well Groomed  Eye Contact:  Good  Speech:  Clear and Coherent and Normal Rate  Volume:  Normal  Mood:  Euthymic  Affect:  Congruent  Thought Process:  Goal Directed and Descriptions of Associations: Circumstantial  Orientation:  Full (Time, Place, and Person)  Thought Content: Logical   Suicidal Thoughts:  No  Homicidal Thoughts:  No  Memory:  WNL  Judgement:  Good  Insight:  Good  Psychomotor Activity:  Normal  Concentration:  Concentration: Good and Attention Span: Good  Recall:  Good  Fund of Knowledge: Good  Language: Good  Assets:  Communication Skills Desire for Improvement Financial Resources/Insurance Housing Leisure Time Resilience Social Support Transportation Vocational/Educational  ADL's:  Intact  Cognition: WNL  Prognosis:  Good   DIAGNOSES:    ICD-10-CM   1. Depression, major, recurrent, in partial remission  F33.41     2. Generalized anxiety disorder  F41.1     3. PMDD (premenstrual dysphoric disorder)  F32.81       Receiving Psychotherapy: No   RECOMMENDATIONS:  PDMP was reviewed.  Ativan  filled 09/10/2024.   I provided approximately  20 minutes of face to face time during this encounter, including time spent before and after the visit in records review, medical decision making, counseling pertinent to today's visit, and charting.   She  is doing well on the current treatment so no changes will be made.  Continue Pristiq  25 mg, 3 po every day. Continue Ativan  0.5 mg, 1 p.o. 3 times daily as needed. Continue Zoloft  25 mg, 1 p.o. daily beginning 5 days before her menses begins and once she starts, stop the Zoloft . Continue multivitamin. Return in 6 months.   Verneita Cooks, PA-C  "

## 2024-12-27 NOTE — Telephone Encounter (Signed)
 Pending PA

## 2024-12-30 NOTE — Telephone Encounter (Signed)
 Patient called in stating that she is on Pristiq  25mg  and prescription was sent to pharmacy but due to new insurance it is unable to be filled. She needs a new prescription sent to pharmacy so she is able to get it. Ph: [phone] appt 7/31 Pharmacy [facility] [address] [place]

## 2024-12-30 NOTE — Telephone Encounter (Signed)
 Called pharmacy and patient picked up Rx 5 days ago. Called patient and she said insurance would not cover. She said she had an old Rx for 50 mg and due to the winter weather picked that one up. Told her that PA had been done for three 25 mg and approved on 1/27 until 12/28/25 so she should be ok for next Rx.

## 2025-01-02 NOTE — Telephone Encounter (Signed)
 PA approved Desvenlafaxine  ER 25 mg #90/30 day 12/28/24-12/28/25 [id number] [facility]

## 2025-07-01 ENCOUNTER — Ambulatory Visit: Admitting: Physician Assistant
